# Patient Record
Sex: Female | Born: 1975 | Race: White | Hispanic: No | Marital: Married | Smoking: Former smoker
Health system: Southern US, Community
[De-identification: ages and names within clinical notes are randomized; demographics above are authoritative.]

## PROBLEM LIST (undated history)

## (undated) DIAGNOSIS — G43019 Migraine without aura, intractable, without status migrainosus: Secondary | ICD-10-CM

## (undated) DIAGNOSIS — E7800 Pure hypercholesterolemia, unspecified: Secondary | ICD-10-CM

## (undated) DIAGNOSIS — F909 Attention-deficit hyperactivity disorder, unspecified type: Secondary | ICD-10-CM

## (undated) DIAGNOSIS — N200 Calculus of kidney: Secondary | ICD-10-CM

## (undated) DIAGNOSIS — G43909 Migraine, unspecified, not intractable, without status migrainosus: Secondary | ICD-10-CM

## (undated) HISTORY — DX: Migraine without aura, intractable, without status migrainosus: G43.019

## (undated) HISTORY — DX: Calculus of kidney: N20.0

## (undated) HISTORY — DX: Pure hypercholesterolemia, unspecified: E78.00

## (undated) HISTORY — DX: Attention-deficit hyperactivity disorder, unspecified type: F90.9

## (undated) HISTORY — DX: Migraine, unspecified, not intractable, without status migrainosus: G43.909

---

## 2006-12-22 LAB — CONVERTED CEMR LAB: Pap Smear: NORMAL

## 2007-05-29 ENCOUNTER — Ambulatory Visit: Payer: Self-pay | Admitting: Internal Medicine

## 2007-05-29 DIAGNOSIS — G43829 Menstrual migraine, not intractable, without status migrainosus: Secondary | ICD-10-CM | POA: Insufficient documentation

## 2007-05-29 DIAGNOSIS — R519 Headache, unspecified: Secondary | ICD-10-CM | POA: Insufficient documentation

## 2007-05-29 DIAGNOSIS — R51 Headache: Secondary | ICD-10-CM | POA: Insufficient documentation

## 2007-05-29 DIAGNOSIS — F988 Other specified behavioral and emotional disorders with onset usually occurring in childhood and adolescence: Secondary | ICD-10-CM | POA: Insufficient documentation

## 2007-11-08 ENCOUNTER — Ambulatory Visit: Payer: Self-pay | Admitting: Internal Medicine

## 2007-11-08 DIAGNOSIS — N63 Unspecified lump in unspecified breast: Secondary | ICD-10-CM | POA: Insufficient documentation

## 2007-11-09 ENCOUNTER — Encounter: Payer: Self-pay | Admitting: Internal Medicine

## 2008-03-18 ENCOUNTER — Telehealth: Payer: Self-pay | Admitting: Internal Medicine

## 2008-05-06 ENCOUNTER — Encounter: Payer: Self-pay | Admitting: Internal Medicine

## 2009-02-26 ENCOUNTER — Ambulatory Visit (HOSPITAL_COMMUNITY): Admission: RE | Admit: 2009-02-26 | Discharge: 2009-02-26 | Payer: Self-pay | Admitting: Obstetrics and Gynecology

## 2010-02-06 ENCOUNTER — Inpatient Hospital Stay (HOSPITAL_COMMUNITY): Admission: RE | Admit: 2010-02-06 | Discharge: 2010-02-09 | Payer: Self-pay | Admitting: Obstetrics and Gynecology

## 2010-02-11 ENCOUNTER — Ambulatory Visit: Admission: RE | Admit: 2010-02-11 | Discharge: 2010-02-11 | Payer: Self-pay | Admitting: Obstetrics and Gynecology

## 2010-06-22 NOTE — Assessment & Plan Note (Signed)
Summary: NEW PT OV/OK PER DR JJ/CCM   Vital Signs:  Patient Profile:   35 Years Old Female Height:     69 inches Weight:      155 pounds Temp:     99.2 degrees F oral Pulse rate:   86 / minute Pulse rhythm:   regular Resp:     12 per minute BP sitting:   142 / 80  Vitals Entered By: Lynann Beaver CMA (May 29, 2007 10:49 AM)                 Chief Complaint:  new pt c/o migraines and and URI with chest congestion.  History of Present Illness: New patient with no form   Headache HPI:      The patient comes in for chronic management of headaches which have been unstable.  Since the last visit, the frequency of headaches have not changed, and the intensity of the headaches have not changed.  The headaches will last anywhere from 2 hours to 3 days at a time.  She has approximately 2 headaches per month.  Headaches have been occurring since age 35.  The patient is right handed.  There is a family history of migraine headaches.        The location of the headaches are unilateral-right.  Precipitating factors consist of periovulatory.  The headaches are associated with nausea.        Positive alarm features include change in features from prior H/A's.  The patient denies first or worst H/A of life, change in frequency from prior H/A's, change in severity from prior H/A's, new onset H/A's in middle-age or later, new or progressive H/A lasting days, H/A's with Valsalva (cough/sneeze), mylagia, weight loss, scalp tenderness, and jaw claudication.        Additional history: more focused and intense in right temple.     Current Allergies (reviewed today): No known allergies  Updated/Current Medications (including changes made in today's visit):  ADDERALL 30 MG  TABS (AMPHETAMINE-DEXTROAMPHETAMINE) one by mouth tid LYBREL 90-20 MCG  TABS (LEVONORGESTREL-ETHINYL ESTRAD) one by mouth daily without skipping FROVA 2.5 MG  TABS (FROVATRIPTAN SUCCINATE) one by mouth at first sign of migraine    Past Medical History:    Reviewed history and no changes required:       Headache       ADHD  Past Surgical History:    Reviewed history and no changes required:       Denies surgical history   Family History:    Reviewed history and no changes required:       Family History High cholesterol       Family History Hypertension  Social History:    Reviewed history and no changes required:       Married       Never Smoked       Alcohol use-yes       Drug use-no       Regular exercise-yes   Risk Factors:  Tobacco use:  never Passive smoke exposure:  no Drug use:  no HIV high-risk behavior:  no Alcohol use:  yes Exercise:  yes    Times per week:  3  Family History Risk Factors:    Family History of MI in females < 53 years old:  no    Family History of MI in males < 56 years old:  no  PAP Smear History:     Date of Last PAP Smear:  12/22/2006  Results:  Normal    Review of Systems       The patient complains of hoarseness and prolonged cough.  The patient denies anorexia, fever, weight loss, vision loss, decreased hearing, peripheral edema, abdominal pain, melena, severe indigestion/heartburn, hematuria, and incontinence.     Physical Exam  General:     Well-developed,well-nourished,in no acute distress; alert,appropriate and cooperative throughout examination Head:     Normocephalic and atraumatic without obvious abnormalities. No apparent alopecia or balding. Eyes:     No corneal or conjunctival inflammation noted. EOMI. Perrla. Funduscopic exam benign, without hemorrhages, exudates or papilledema. Vision grossly normal. Nose:     External nasal examination shows no deformity or inflammation. Nasal mucosa are pink and moist without lesions or exudates. Mouth:     Oral mucosa and oropharynx without lesions or exudates.  Teeth in good repair. Neck:     No deformities, masses, or tenderness noted. Lungs:     Normal respiratory effort, chest expands  symmetrically. Lungs are clear to auscultation, no crackles or wheezes. Heart:     Normal rate and regular rhythm. S1 and S2 normal without gallop, murmur, click, rub or other extra sounds. Abdomen:     Bowel sounds positive,abdomen soft and non-tender without masses, organomegaly or hernias noted. Msk:     No deformity or scoliosis noted of thoracic or lumbar spine.   Pulses:     R and L carotid,radial,femoral,dorsalis pedis and posterior tibial pulses are full and equal bilaterally Extremities:     No clubbing, cyanosis, edema, or deformity noted with normal full range of motion of all joints.   Neurologic:     No cranial nerve deficits noted. Station and gait are normal. Plantar reflexes are down-going bilaterally. DTRs are symmetrical throughout. Sensory, motor and coordinative functions appear intact. Skin:     Intact without suspicious lesions or rashes Cervical Nodes:     No lymphadenopathy noted Axillary Nodes:     No palpable lymphadenopathy Psych:     Cognition and judgment appear intact. Alert and cooperative with normal attention span and concentration. No apparent delusions, illusions, hallucinations    Impression & Recommendations:  Problem # 1:  MENST MIGRAINE W/O INTRACT W/O STATUS MIGRNOSUS (ICD-346.40) samples of  frova    Headache diary reviewed.  Her updated medication list for this problem includes:    Frova 2.5 Mg Tabs (Frovatriptan succinate) ..... 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 One by mouth tid 2)  Lybrel 90-20 Mcg Tabs (Levonorgestrel-ethinyl estrad) .... One by mouth daily without skipping 3)  Frova 2.5 Mg Tabs (Frovatriptan succinate) .... One by mouth at first sign of migraine   Patient Instructions: 1)  Please schedule a follow-up appointment in 2 months.     Prescriptions: LYBREL 90-20 MCG  TABS (LEVONORGESTREL-ETHINYL ESTRAD) one by mouth daily without skipping  #28 x 11   Entered and Authorized by:   Stacie Glaze MD   Signed by:   Stacie Glaze MD on 05/29/2007   Method used:   Print then Give to Patient   RxID:   [id number]  ]

## 2010-06-22 NOTE — Progress Notes (Signed)
Summary: dog bite  Phone Note Call from Patient   Caller: Dad Summary of Call: [phone] Dog bite (pt's dog) last night.  3 puncture wounds that seem to be getting infected.  Dad wants Dr. Lovell Sheehan to see her today. Lesions are red and swollen this am.  Initial call taken by: Lynann Beaver CMA,  March 18, 2008 10:57 AM  Follow-up for Phone Call        Father called again, daughters left thumb is more reddened and swollen, anxious to get some directives from Dr Lovell Sheehan, OV or RX?  Daughter bitten by family personal dogs,all shots up to date. Daughter just graduated from [employer], he is quite sure she has had Td booster, we do not have any record in EMR of her Td status.  Left message for daughter to call back with Td status Follow-up by: Sid Falcon LPN,  March 18, 2008 12:50 PM  Additional Follow-up for Phone Call Additional follow up Details #1::        per d rjenkins- augmentin 875 two times a day for 7 days Additional Follow-up by: Willy Eddy, LPN,  March 18, 2008 1:30 PM    New/Updated Medications: AUGMENTIN 875-125 MG TABS (AMOXICILLIN-POT CLAVULANATE) one by mouth two times a day x 7 days.   Prescriptions: AUGMENTIN 875-125 MG TABS (AMOXICILLIN-POT CLAVULANATE) one by mouth two times a day x 7 days.  #14 x 0   Entered by:   Lynann Beaver CMA   Authorized by:   Stacie Glaze MD   Signed by:   Lynann Beaver CMA on 03/18/2008   Method used:   Electronically to        [facility] [address]  [id number] (ret[facility])       [address]       [place], [place]  [address]       Ph: [phone] or [phone]       Fax: [phone]   RxID:   [id number]  Pt's father notified.

## 2010-06-22 NOTE — Assessment & Plan Note (Signed)
Summary: bump on chest/per bonnye/jls   Vital Signs:  Patient Profile:   35 Years Old Female Height:     69 inches Temp:     98.7 degrees F oral Pulse rate:   76 / minute Resp:     14 per minute BP sitting:   130 / 80  (left arm)  Vitals Entered By: Willy Eddy, LPN (November 08, 2007 3:59 PM)                 Visit Type:  acuteBreast mass in the left  PCP:  Stacie Glaze MD  Chief Complaint:  c/o lump on left breast.  History of Present Illness: breast mas in the left breast about 3  on the clock with fibercystic changes and some slight tenderness at the site noted anbout a week ago    Current Allergies: No known allergies       Physical Exam  General:     Well-developed,well-nourished,in no acute distress; alert,appropriate and cooperative throughout examination Eyes:     No corneal or conjunctival inflammation noted. EOMI. Perrla. Funduscopic exam benign, without hemorrhages, exudates or papilledema. Vision grossly normal. Nose:     External nasal examination shows no deformity or inflammation. Nasal mucosa are pink and moist without lesions or exudates. Mouth:     Oral mucosa and oropharynx without lesions or exudates.  Teeth in good repair. Breasts:     L breast thickening.  , cyst Lungs:     Normal respiratory effort, chest expands symmetrically. Lungs are clear to auscultation, no crackles or wheezes.    Impression & Recommendations:  Problem # 1:  BREAST MASS, LEFT (ICD-611.72) Mammogram was ordered today. The patient will be sent to a breast surgeon following mammogram for evaluation.  Orders: Radiology Referral (Radiology)   Problem # 2:  HEADACHE (ICD-784.0) stable patern Her updated medication list for this problem includes:    Frova 2.5 Mg Tabs (Frovatriptan succinate) ..... One by mouth at first sign of migraine Headache diary reviewed.   Complete Medication List: 1)  Adderall 30 Mg Tabs (Amphetamine-dextroamphetamine) .... One by  mouth tid 2)  Lybrel 90-20 Mcg Tabs (Levonorgestrel-ethinyl estrad) .... One by mouth daily without skipping 3)  Frova 2.5 Mg Tabs (Frovatriptan succinate) .... One by mouth at first sign of migraine    ]

## 2010-08-05 LAB — CBC
HCT: 28.4 % — ABNORMAL LOW (ref 36.0–46.0)
HCT: 28.9 % — ABNORMAL LOW (ref 36.0–46.0)
HCT: 34.1 % — ABNORMAL LOW (ref 36.0–46.0)
Hemoglobin: 10.1 g/dL — ABNORMAL LOW (ref 12.0–15.0)
Hemoglobin: 11.2 g/dL — ABNORMAL LOW (ref 12.0–15.0)
Hemoglobin: 9.7 g/dL — ABNORMAL LOW (ref 12.0–15.0)
MCH: 31.2 pg (ref 26.0–34.0)
MCH: 32.2 pg (ref 26.0–34.0)
MCH: 33 pg (ref 26.0–34.0)
MCHC: 32.9 g/dL (ref 30.0–36.0)
MCHC: 34.2 g/dL (ref 30.0–36.0)
MCHC: 35 g/dL (ref 30.0–36.0)
MCV: 94.2 fL (ref 78.0–100.0)
MCV: 94.3 fL (ref 78.0–100.0)
MCV: 95 fL (ref 78.0–100.0)
Platelets: 179 10*3/uL (ref 150–400)
Platelets: 179 10*3/uL (ref 150–400)
Platelets: 218 10*3/uL (ref 150–400)
RBC: 3.02 MIL/uL — ABNORMAL LOW (ref 3.87–5.11)
RBC: 3.07 MIL/uL — ABNORMAL LOW (ref 3.87–5.11)
RBC: 3.59 MIL/uL — ABNORMAL LOW (ref 3.87–5.11)
RDW: 14.1 % (ref 11.5–15.5)
RDW: 15.1 % (ref 11.5–15.5)
RDW: 15.5 % (ref 11.5–15.5)
WBC: 10.6 10*3/uL — ABNORMAL HIGH (ref 4.0–10.5)
WBC: 8.3 10*3/uL (ref 4.0–10.5)
WBC: 8.7 10*3/uL (ref 4.0–10.5)

## 2010-08-05 LAB — RPR: RPR Ser Ql: NONREACTIVE

## 2013-05-23 HISTORY — PX: BREAST ENHANCEMENT SURGERY: SHX7

## 2016-03-16 ENCOUNTER — Encounter: Payer: Self-pay | Admitting: Neurology

## 2016-03-16 ENCOUNTER — Ambulatory Visit (INDEPENDENT_AMBULATORY_CARE_PROVIDER_SITE_OTHER): Payer: 59 | Admitting: Neurology

## 2016-03-16 DIAGNOSIS — G43019 Migraine without aura, intractable, without status migrainosus: Secondary | ICD-10-CM

## 2016-03-16 HISTORY — DX: Migraine without aura, intractable, without status migrainosus: G43.019

## 2016-03-16 MED ORDER — DEXAMETHASONE 2 MG PO TABS: ORAL_TABLET | ORAL | 0 refills | Status: DC

## 2016-03-16 MED ORDER — ZONISAMIDE 25 MG PO CAPS: ORAL_CAPSULE | ORAL | 2 refills | Status: DC

## 2016-03-16 NOTE — Progress Notes (Signed)
Reason for visit: Migraine   Referring physician: Dr. Rudi Coco is a 40 y.o. female  History of present illness:  Sharon Osborn is a 40 year old right-handed white female with a history of intractable migraine. The patient has had migraine headaches off and on since age 60. The patient had some increase in frequency when she was in college, but during her childbearing years, she really had no headaches. The patient over the last year has begun having increased frequency of headache, but over the last 6 months the headaches have become very frequent with on average 16 headache days a month. The patient may have 7 days of headache that begin just prior to her menstrual cycle, and continue on. She may go 3 days without headache, and then get another headache lasting about a week. The patient has multiple activators for her headache that include stress, alcohol intake, chocolate, certain odors such as gasoline or perfumes, bright light, salty foods may also bring on headache. The patient has been seen through [facility], she was placed on Inderal in low dose taking 10 mg daily. The patient could not tolerate even this low dose, and she had to stop the medication. The patient has been taking Maxalt and diclofenac for the headache with some benefit, but she is requiring large amounts of these medications. The patient may have scintillating scotoma prior to the onset of the headache, she may also have some cognitive slowing and slurred speech before the headache begins. She denies any numbness or weakness with the headache, she does have nausea and vomiting and photophobia and phonophobia. The headache may last several hours, sleep does not help the headache. The patient is having difficulty keeping up with her work because of the headache. She comes to this office for an evaluation. The headache is usually in the right greater than left temporal regions.  Past Medical History:  Diagnosis  Date  . ADHD   . High cholesterol   . Migraine     Past Surgical History:  Procedure Laterality Date  . BREAST ENHANCEMENT SURGERY  2015    Family History  Problem Relation Age of Onset  . Migraines Mother   . Heart disease Mother   . High Cholesterol Mother   . Breast cancer Mother   . Heart disease Father   . High Cholesterol Father   . Migraines Sister   . Seizures Sister   . Parkinson's disease Maternal Grandmother   . Heart disease Maternal Grandfather   . Heart disease Paternal Grandfather   . High Cholesterol Paternal Grandfather   . Migraines Sister     Social history:  reports that she quit smoking about 9 years ago. She has never used smokeless tobacco. She reports that she does not drink alcohol or use drugs.  Medications:  Prior to Admission medications   Medication Sig Start Date End Date Taking? Authorizing Provider  amphetamine-dextroamphetamine (ADDERALL) 20 MG tablet  02/04/16  Yes Historical Provider, MD  buPROPion [facility] SR) 150 MG 12 hr tablet  01/02/16  Yes Historical Provider, MD  diclofenac (VOLTAREN) 75 MG EC tablet  02/19/16  Yes Historical Provider, MD  rizatriptan (MAXALT-MLT) 10 MG disintegrating tablet TAKE ONE (1) TABLET BY MOUTH AT ONSET OF HEADACHE MAY REPEAT ONCE IN 2 HOURS. MAX OF 2/24 HOURS or 6 PER WEEK 02/29/16  Yes Historical Provider, MD      Allergies  Allergen Reactions  . Amoxicillin Hives  . Inderal [Propranolol] Other (See  Comments)    Fainting spells    ROS:  Out of a complete 14 system review of symptoms, the patient complains only of the following symptoms, and all other reviewed systems are negative.  Headache  Height 5\' 10"  (1.778 m), weight 134 lb (60.8 kg).  Physical Exam  General: The patient is alert and cooperative at the time of the examination.  Eyes: Pupils are equal, round, and reactive to light. Discs are flat bilaterally.  Neck: The neck is supple, no carotid bruits are noted.  Respiratory: The  respiratory examination is clear.  Cardiovascular: The cardiovascular examination reveals a regular rate and rhythm, no obvious murmurs or rubs are noted.  Skin: Extremities are without significant edema.  Neurologic Exam  Mental status: The patient is alert and oriented x 3 at the time of the examination. The patient has apparent normal recent and remote memory, with an apparently normal attention span and concentration ability.  Cranial nerves: Facial symmetry is present. There is good sensation of the face to pinprick and soft touch bilaterally. The strength of the facial muscles and the muscles to head turning and shoulder shrug are normal bilaterally. Speech is well enunciated, no aphasia or dysarthria is noted. Extraocular movements are full. Visual fields are full. The tongue is midline, and the patient has symmetric elevation of the soft palate. No obvious hearing deficits are noted.  Motor: The motor testing reveals 5 over 5 strength of all 4 extremities. Good symmetric motor tone is noted throughout.  Sensory: Sensory testing is intact to pinprick, soft touch, vibration sensation, and position sense on all 4 extremities. No evidence of extinction is noted.  Coordination: Cerebellar testing reveals good finger-nose-finger and heel-to-shin bilaterally.  Gait and station: Gait is normal. Tandem gait is normal. Romberg is negative. No drift is seen.  Reflexes: Deep tendon reflexes are symmetric and normal bilaterally. Toes are downgoing bilaterally.   Assessment/Plan:  1. Migraine headache  The patient has intractable headaches at this time, averaging 16 headache days a month. The patient has not been on any effective prophylactic medication course. She has a history of renal calculi, she has required hospitalizations on 2 occasions because of this. The patient will be placed on Zonegran, working up on the dose. The patient will continue her Maxalt and diclofenac. A prescription was  given for a Decadron taper for the next prolonged headache. She will follow-up in 2 months. The patient will call our office for dose adjustments. In the future, she may be a candidate for Botox.  Sharon Osborn. Sharon Jorge Retz MD 03/16/2016 11:40 AM  [facility] [address] [place], [place] [id number]  Phone [phone] Fax [phone]

## 2016-03-16 NOTE — Patient Instructions (Addendum)

## 2016-03-17 ENCOUNTER — Ambulatory Visit: Payer: 59 | Admitting: Neurology

## 2016-05-23 DIAGNOSIS — N200 Calculus of kidney: Secondary | ICD-10-CM

## 2016-05-23 HISTORY — DX: Calculus of kidney: N20.0

## 2016-05-25 ENCOUNTER — Telehealth: Payer: Self-pay | Admitting: Neurology

## 2016-05-25 MED ORDER — DICLOFENAC SODIUM 75 MG PO TBEC: 75.0000 mg | DELAYED_RELEASE_TABLET | Freq: Two times a day (BID) | ORAL | 2 refills | Status: DC | PRN

## 2016-05-25 NOTE — Addendum Note (Signed)
Addended by: Stephanie AcreWILLIS, Kathya Wilz on: 05/25/2016 10:49 AM   Modules accepted: Orders

## 2016-05-25 NOTE — Telephone Encounter (Signed)
Pt requesting refill for diclofenac (VOLTAREN) 75 MG EC tablet.

## 2016-05-25 NOTE — Telephone Encounter (Signed)
I will prescribe the diclofenac.

## 2016-05-25 NOTE — Telephone Encounter (Signed)
New pt w/ migraines seen in Oct. Has follow-up scheduled later this month. Voltaren reported by pt, prescribed by previous provider.

## 2016-05-26 ENCOUNTER — Ambulatory Visit: Payer: 59 | Admitting: Adult Health

## 2016-05-31 ENCOUNTER — Telehealth: Payer: Self-pay | Admitting: Neurology

## 2016-05-31 NOTE — Telephone Encounter (Signed)
Patient says her hands get really cold and go completely white intermittently especially when temperature is really cold. Could this be a side effect of rizatriptan (MAXALT-MLT) 10 MG disintegrating tablet.

## 2016-05-31 NOTE — Telephone Encounter (Signed)
I called patient. 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

## 2016-06-16 ENCOUNTER — Ambulatory Visit: Payer: 59 | Admitting: Adult Health

## 2016-06-20 ENCOUNTER — Telehealth: Payer: Self-pay | Admitting: Neurology

## 2016-06-20 MED ORDER — RIZATRIPTAN BENZOATE 10 MG PO TBDP: ORAL_TABLET | ORAL | 5 refills | Status: DC

## 2016-06-20 NOTE — Addendum Note (Signed)
Addended by: Donnelly AngelicaHOGAN, Amylia Collazos L on: 06/20/2016 05:15 PM   Modules accepted: Orders

## 2016-06-20 NOTE — Telephone Encounter (Signed)
Pt called requesting refill forrizatriptan (MAXALT-MLT) 10 MG disintegrating tablet

## 2016-06-20 NOTE — Telephone Encounter (Signed)
Refills e-scribed as requested. 

## 2016-06-27 ENCOUNTER — Other Ambulatory Visit: Payer: Self-pay

## 2016-06-27 ENCOUNTER — Other Ambulatory Visit: Payer: Self-pay | Admitting: Neurology

## 2016-06-27 MED ORDER — RIZATRIPTAN BENZOATE 10 MG PO TABS: 10.0000 mg | ORAL_TABLET | Freq: Three times a day (TID) | ORAL | 5 refills | Status: DC | PRN

## 2016-07-07 ENCOUNTER — Encounter: Payer: Self-pay | Admitting: Adult Health

## 2016-07-07 ENCOUNTER — Ambulatory Visit (INDEPENDENT_AMBULATORY_CARE_PROVIDER_SITE_OTHER): Payer: 59 | Admitting: Adult Health

## 2016-07-07 VITALS — BP 142/80 | HR 96 | Resp 20 | Ht 70.0 in | Wt 139.0 lb

## 2016-07-07 DIAGNOSIS — G43119 Migraine with aura, intractable, without status migrainosus: Secondary | ICD-10-CM

## 2016-07-07 NOTE — Progress Notes (Signed)
PATIENT: Sharon Osborn DOB: [birth year]  REASON FOR VISIT: follow up- intractable migraines HISTORY FROM: patient  HISTORY OF PRESENT ILLNESS: Sharon Osborn is a 41 year old female with a history of intractable migraines. She returns today for follow-up. She reports that she was on Zonegran was stopped medication in January. She states it made her feel "spaced out." She also that the flu and kidney stones in January and never restarted the medication. She states that she has approximately 17 headache days a month. Her headaches always occur in the right temporal region. She does have photophobia, phonophobia, nausea and vomiting. She states that she will usually see stars in her periphery before the headache starts. She reports that she does get some benefit from Maxalt but is not working as well as it was. She has tried diclofenac, Zonegran, Maxalt, Imitrex and Inderal with limited or no benefit. She returns today for an evaluation.  HISTORY 03/16/16 (WILLIS): Sharon Osborn is a 41 year old right-handed white female with a history of intractable migraine. The patient has had migraine headaches off and on since age 46. The patient had some increase in frequency when she was in college, but during her childbearing years, she really had no headaches. The patient over the last year has begun having increased frequency of headache, but over the last 6 months the headaches have become very frequent with on average 16 headache days a month. The patient may have 7 days of headache that begin just prior to her menstrual cycle, and continue on. She may go 3 days without headache, and then get another headache lasting about a week. The patient has multiple activators for her headache that include stress, alcohol intake, chocolate, certain odors such as gasoline or perfumes, bright light, salty foods may also bring on headache. The patient has been seen through [facility], she was placed on Inderal in low dose  taking 10 mg daily. The patient could not tolerate even this low dose, and she had to stop the medication. The patient has been taking Maxalt and diclofenac for the headache with some benefit, but she is requiring large amounts of these medications. The patient may have scintillating scotoma prior to the onset of the headache, she may also have some cognitive slowing and slurred speech before the headache begins. She denies any numbness or weakness with the headache, she does have nausea and vomiting and photophobia and phonophobia. The headache may last several hours, sleep does not help the headache. The patient is having difficulty keeping up with her work because of the headache. She comes to this office for an evaluation. The headache is usually in the right greater than left temporal regions.  REVIEW OF SYSTEMS: Out of a complete 14 system review of symptoms, the patient complains only of the following symptoms, and all other reviewed systems are negative.  Daytime sleepiness  ALLERGIES: Allergies  Allergen Reactions  . Amoxicillin Hives  . Inderal [Propranolol] Other (See Comments)    Fainting spells    HOME MEDICATIONS: Outpatient Medications Prior to Visit  Medication Sig Dispense Refill  . amphetamine-dextroamphetamine (ADDERALL) 20 MG tablet     . buPROPion (WELLBUTRIN SR) 150 MG 12 hr tablet     . dexamethasone (DECADRON) 2 MG tablet Take 3 tablets first day, 2 the next and 1 the next day 6 tablet 0  . diclofenac (VOLTAREN) 75 MG EC tablet Take 1 tablet (75 mg total) by mouth 2 (two) times daily as needed. 60 tablet 2  .  rizatriptan (MAXALT) 10 MG tablet Take 1 tablet (10 mg total) by mouth 3 (three) times daily as needed for migraine. 10 tablet 5  . zonisamide (ZONEGRAN) 25 MG capsule One capsule twice a day for one week, then take 2 capsules twice a day 120 capsule 2   No facility-administered medications prior to visit.     PAST MEDICAL HISTORY: Past Medical History:    Diagnosis Date  . ADHD   . Common migraine with intractable migraine 03/16/2016  . High cholesterol   . Migraine     PAST SURGICAL HISTORY: Past Surgical History:  Procedure Laterality Date  . BREAST ENHANCEMENT SURGERY  2015    FAMILY HISTORY: Family History  Problem Relation Age of Onset  . Migraines Mother   . Heart disease Mother   . High Cholesterol Mother   . Breast cancer Mother   . Heart disease Father   . High Cholesterol Father   . Migraines Sister   . Seizures Sister   . Parkinson's disease Maternal Grandmother   . Heart disease Maternal Grandfather   . Heart disease Paternal Grandfather   . High Cholesterol Paternal Grandfather   . Migraines Sister     SOCIAL HISTORY: Social History   Social History  . Marital status: Married    Spouse name: N/A  . Number of children: 2  . Years of education: Grad school   Occupational History  . [employer]    Social History Main Topics  . Smoking status: Former Smoker    Quit date: 03/17/2007  . Smokeless tobacco: Never Used  . Alcohol use No     Comment: 2010  . Drug use: No  . Sexual activity: Not on file     Comment: Married   Other Topics Concern  . Not on file   Social History Narrative   Lives at home w/ her husband and 2 sons   Right-handed   Caffeine: 4-6 cups of coffee per day      PHYSICAL EXAM  Vitals:   07/07/16 0739  BP: (!) 142/80  Pulse: 96  Resp: 20  Weight: 139 lb (63 kg)  Height: 5\' 10"  (1.778 m)   Body mass index is 19.94 kg/m.  Generalized: Well developed, in no acute distress   Neurological examination  Mentation: Alert oriented to time, place, history taking. Follows all commands speech and language fluent Cranial nerve II-XII: Pupils were equal round reactive to light. Extraocular movements were full, visual field were full on confrontational test. Facial sensation and strength were normal. Uvula tongue midline. Head turning and shoulder shrug  were normal  and symmetric. Motor: The motor testing reveals 5 over 5 strength of all 4 extremities. Good symmetric motor tone is noted throughout.  Sensory: Sensory testing is intact to soft touch on all 4 extremities. No evidence of extinction is noted.  Coordination: Cerebellar testing reveals good finger-nose-finger and heel-to-shin bilaterally.  Gait and station: Gait is normal. Tandem gait is normal. Romberg is negative. No drift is seen.  Reflexes: Deep tendon reflexes are symmetric and normal bilaterally.   DIAGNOSTIC DATA (LABS, IMAGING, TESTING) - I reviewed patient records, labs, notes, testing and imaging myself where available.  Lab Results  Component Value Date   WBC 8.3 02/09/2010   HGB 9.7 (L) 02/09/2010   HCT 28.4 (L) 02/09/2010   MCV 94.3 02/09/2010   PLT 179 02/09/2010      ASSESSMENT AND PLAN 41 y.o. year old female  has a past  medical history of ADHD; Common migraine with intractable migraine (03/16/2016); High cholesterol; and Migraine. here with:  1. Migraine headaches  The patient continues to have migraine headaches. She has not responded to previous medication. She has tried and failed diclofenac, Zonegran, Maxalt, Imitrex and Inderal. She would like to try Botox. I will get this set up for the patient. An appointment will be set up as soon as her insurance approves Botox. Otherwise she will plan to follow-up in 6 months or sooner if needed.  I spent 15 minutes with the patient 50% of this time was spent discussing Botox treatment.    Butch Penny, MSN, NP-C 07/07/2016, 7:36 AM [facility] [address] [place], [place] [address] [phone]

## 2016-07-07 NOTE — Progress Notes (Signed)
I have read the note, and I agree with the clinical assessment and plan.  Sharon Osborn   

## 2016-07-07 NOTE — Patient Instructions (Signed)
Botox therapy will be initiated  If your symptoms worsen or you develop new symptoms please let us know.

## 2016-07-15 ENCOUNTER — Telehealth: Payer: Self-pay | Admitting: Neurology

## 2016-07-15 MED ORDER — SUMATRIPTAN SUCCINATE 100 MG PO TABS: 100.0000 mg | ORAL_TABLET | Freq: Two times a day (BID) | ORAL | 2 refills | Status: DC | PRN

## 2016-07-15 NOTE — Telephone Encounter (Signed)
Called pt pharmacy. Spoke with [place]. He [facility] only [facility] 4 tablets at a time. This is for 12 day supply. She just got rx on 07/08/16. On 07/20/16 , she can receive 4 more tablets.

## 2016-07-15 NOTE — Telephone Encounter (Signed)
Dr Anne HahnWillis- what would you like to do? See message below

## 2016-07-15 NOTE — Telephone Encounter (Signed)
Rx for botox has been sent over to [facility].

## 2016-07-15 NOTE — Telephone Encounter (Signed)
Patient has requested a refill on rizatriptan.

## 2016-07-15 NOTE — Telephone Encounter (Signed)
I called patient. The insurance company will only allow 4 tablets of Maxalt to be given at one time, I will try Imitrex to see if this will work out better for her.

## 2016-07-15 NOTE — Telephone Encounter (Signed)
Called and spoke to pt. Advised rx sent 06/27/16 qty 10, 5 refills. She was only given 4 tablets.   Advised I will call pharmacy to see what happened and call back to advise. She verbalized understanding.

## 2016-07-21 ENCOUNTER — Encounter: Payer: Self-pay | Admitting: Neurology

## 2016-07-21 ENCOUNTER — Ambulatory Visit (INDEPENDENT_AMBULATORY_CARE_PROVIDER_SITE_OTHER): Payer: 59 | Admitting: Neurology

## 2016-07-21 VITALS — BP 120/77 | HR 64 | Ht 70.0 in | Wt 139.5 lb

## 2016-07-21 DIAGNOSIS — G43019 Migraine without aura, intractable, without status migrainosus: Secondary | ICD-10-CM | POA: Diagnosis not present

## 2016-07-21 MED ORDER — ELETRIPTAN HYDROBROMIDE 40 MG PO TABS: 40.0000 mg | ORAL_TABLET | Freq: Two times a day (BID) | ORAL | 3 refills | Status: DC | PRN

## 2016-07-21 NOTE — Procedures (Signed)
     BOTOX PROCEDURE NOTE FOR MIGRAINE HEADACHE   HISTORY: Sharon Osborn is a 41 year old patient with a history of intractable migraine. She has had very frequent migraine, medication so far have not been beneficial. She has just come off of a 7 day daily headache, she has missed one day of work. She comes in for her first Botox treatment session.   Description of procedure:  The patient was placed in a sitting position. The standard protocol was used for Botox as follows, with 5 units of Botox injected at each site:   -Procerus muscle, midline injection  -Corrugator muscle, bilateral injection  -Frontalis muscle, bilateral injection, with 2 sites each side, medial injection was performed in the upper one third of the frontalis muscle, in the region vertical from the medial inferior edge of the superior orbital rim. The lateral injection was again in the upper one third of the forehead vertically above the lateral limbus of the cornea, 1.5 cm lateral to the medial injection site.  -Temporalis muscle injection, 4 sites, bilaterally. The first injection was 3 cm above the tragus of the ear, second injection site was 1.5 cm to 3 cm up from the first injection site in line with the tragus of the ear. The third injection site was 1.5-3 cm forward between the first 2 injection sites. The fourth injection site was 1.5 cm posterior to the second injection site.  -Occipitalis muscle injection, 3 sites, bilaterally. The first injection was done one half way between the occipital protuberance and the tip of the mastoid process behind the ear. The second injection site was done lateral and superior to the first, 1 fingerbreadth from the first injection. The third injection site was 1 fingerbreadth superiorly and medially from the first injection site.  -Cervical paraspinal muscle injection, 2 sites, bilateral, the first injection site was 1 cm from the midline of the cervical spine, 3 cm inferior to the  lower border of the occipital protuberance. The second injection site was 1.5 cm superiorly and laterally to the first injection site.  -Trapezius muscle injection was performed at 3 sites, bilaterally. The first injection site was in the upper trapezius muscle halfway between the inflection point of the neck, and the acromion. The second injection site was one half way between the acromion and the first injection site. The third injection was done between the first injection site and the inflection point of the neck.   A 200 unit bottle of Botox was used, 155 units were injected, the rest of the Botox was wasted. The patient tolerated the procedure well, there were no complications of the above procedure.  Botox NDC [id number] Lot number [id number] Expiration date August 2020

## 2016-07-21 NOTE — Progress Notes (Signed)
Please refer to Botox procedure note.  The patient was taken off of Imitrex, this does not seem to help the headache, she was placed on Relpax. The patient has just had a 7 day daily headache. She has missed one day of work.

## 2016-08-09 ENCOUNTER — Ambulatory Visit: Payer: 59 | Admitting: Adult Health

## 2016-10-11 ENCOUNTER — Telehealth: Payer: Self-pay | Admitting: Neurology

## 2016-10-11 NOTE — Telephone Encounter (Signed)
[facility] [phone] confirmed delivery for 10/25/16.

## 2016-10-18 NOTE — Telephone Encounter (Signed)
Noted, thank you

## 2016-11-04 ENCOUNTER — Ambulatory Visit (INDEPENDENT_AMBULATORY_CARE_PROVIDER_SITE_OTHER): Payer: 59 | Admitting: Neurology

## 2016-11-04 ENCOUNTER — Encounter: Payer: Self-pay | Admitting: Neurology

## 2016-11-04 VITALS — BP 110/74 | HR 79

## 2016-11-04 DIAGNOSIS — G43019 Migraine without aura, intractable, without status migrainosus: Secondary | ICD-10-CM

## 2016-11-04 NOTE — Progress Notes (Signed)
Please refer to Botox procedure note. 

## 2016-11-04 NOTE — Procedures (Signed)
     BOTOX PROCEDURE NOTE FOR MIGRAINE HEADACHE   HISTORY: Sharon Osborn is a 41 year old patient with a history of intractable migraine headache. The patient has come in for her second Botox injection. The first injection resulted in a significant reduction in headache severity, she believes that the frequency of the headache was about the same but she was able to easily control the headaches with rescue medications. She has not missed work because of the headache. Within the last 3 weeks prior to this injection, the headaches have returned to their usual frequency and severity. The patient is pleased with the response from the Botox.   Description of procedure:  The patient was placed in a sitting position. The standard protocol was used for Botox as follows, with 5 units of Botox injected at each site:   -Procerus muscle, midline injection  -Corrugator muscle, bilateral injection  -Frontalis muscle, bilateral injection, with 2 sites each side, medial injection was performed in the upper one third of the frontalis muscle, in the region vertical from the medial inferior edge of the superior orbital rim. The lateral injection was again in the upper one third of the forehead vertically above the lateral limbus of the cornea, 1.5 cm lateral to the medial injection site.  -Temporalis muscle injection, 4 sites, bilaterally. The first injection was 3 cm above the tragus of the ear, second injection site was 1.5 cm to 3 cm up from the first injection site in line with the tragus of the ear. The third injection site was 1.5-3 cm forward between the first 2 injection sites. The fourth injection site was 1.5 cm posterior to the second injection site.  -Occipitalis muscle injection, 3 sites, bilaterally. The first injection was done one half way between the occipital protuberance and the tip of the mastoid process behind the ear. The second injection site was done lateral and superior to the first, 1  fingerbreadth from the first injection. The third injection site was 1 fingerbreadth superiorly and medially from the first injection site.  -Cervical paraspinal muscle injection, 2 sites, bilateral, the first injection site was 1 cm from the midline of the cervical spine, 3 cm inferior to the lower border of the occipital protuberance. The second injection site was 1.5 cm superiorly and laterally to the first injection site.  -Trapezius muscle injection was performed at 3 sites, bilaterally. The first injection site was in the upper trapezius muscle halfway between the inflection point of the neck, and the acromion. The second injection site was one half way between the acromion and the first injection site. The third injection was done between the first injection site and the inflection point of the neck.   A 200 unit bottle of Botox was used, 155 units were injected, the rest of the Botox was wasted. The patient tolerated the procedure well, there were no complications of the above procedure.  Botox NDC [id number] Lot number [id number] Expiration date 04/2019

## 2016-11-14 ENCOUNTER — Other Ambulatory Visit: Payer: Self-pay | Admitting: Neurology

## 2016-11-15 ENCOUNTER — Telehealth: Payer: Self-pay | Admitting: Neurology

## 2016-11-15 MED ORDER — PREDNISONE 5 MG PO TABS: ORAL_TABLET | ORAL | 0 refills | Status: DC

## 2016-11-15 NOTE — Telephone Encounter (Signed)
I called patient. The patient's had daily headaches since the Botox injection. I will send in a prednisone Dosepak, if this is not effective, we will bring the patient in for a Depacon injection.

## 2016-11-15 NOTE — Addendum Note (Signed)
Addended by: York SpanielWILLIS, CHARLES K on: 11/15/2016 12:56 PM   Modules accepted: Orders

## 2016-11-15 NOTE — Telephone Encounter (Signed)
Pt calling because since Botox approx 10 days ago pt said she has had a migraine everyday either all day or for majority of the day.  She is wanting a call back advising what she can do

## 2016-11-24 ENCOUNTER — Telehealth: Payer: Self-pay

## 2016-11-24 NOTE — Telephone Encounter (Signed)
Patient called office in reference to completing prednisone Dosepak and continuing to have migraines.  Please call

## 2016-11-24 NOTE — Telephone Encounter (Signed)
RN call patient back about her increase headaches. Pt stated she finish the prednisone steroids yesterday. She was on the steroids for 6 days. Pt has been taking the voltaren pills daily. She also takes the eletriptan daily for onset of headaches. Pt is not taking the zonegran.it cause side effects. She had her last botox in June 2018. Pt stated when she first got botox in March she had relief, but the last injection did not give much relief. Pt stated her pain level is a 9 today. When she takes the voltaren, and eletriptan it gives relief but in about 2 to hrs the headache comes back. For the past two weeks the headaches are daily. Rn stated a message will be sent to Dr. Pearlean BrownieSethi the work in am. Pt verbalized understanding.

## 2016-11-24 NOTE — Telephone Encounter (Signed)
Left vm for patient to call back about still having a headache. PT just completed a prednisone dosepak, and is still having headache. PT is currently on botox, and receive that injection on 11/04/2016.

## 2016-11-24 NOTE — Telephone Encounter (Signed)
If the patient is willing to come in and she can have Depacon infusion in the office and the nurse is available to do so

## 2016-11-24 NOTE — Telephone Encounter (Signed)
Rn call patient that Dr. Pearlean BrownieSethi stated she can come in for infusion for her headaches. Rn stated infusion nurse was notified of her having a headache. RN stated the infusion nurse stated she needs to come in before 0200. Rn stated the infusion nurse can see her now. Rn remind pt to come to [facility] now. PT stated she was on the way.

## 2016-11-24 NOTE — Telephone Encounter (Signed)
Per infusion nurse pt had steroid infusion. Pt drove herself to the office with a headache of 9. Pt stated to infusion nurse that it help the headache and she feels better.Mindy explain to patient about the infusion is not a monthly or daily regimen. [facility] and will take her prescribed medications.

## 2016-11-27 MED ORDER — PREDNISONE 10 MG PO TABS: ORAL_TABLET | ORAL | 0 refills | Status: DC

## 2016-11-27 NOTE — Addendum Note (Signed)
Addended by: York SpanielWILLIS, Tovia Kisner K on: 11/27/2016 03:31 PM   Modules accepted: Orders

## 2016-11-27 NOTE — Telephone Encounter (Signed)
I called patient. The patient has had frequent almost daily headaches. The epicondyle injection did help, but the headache came back after a day and a half. I will place her on a 12 day 10 mg prednisone Dosepak.  The patient should be a good candidate for [facility]. I will get a form filled out for her.  The patient claims that Zonegran made her too spacey.

## 2016-11-28 NOTE — Telephone Encounter (Signed)
Spoke to [place] - [place] private, personal fax at home.  [employer] start form faxed to her so she could sign and she faxed it back.  Completed form faxed and confirmed to [employer].

## 2016-12-12 ENCOUNTER — Telehealth: Payer: Self-pay | Admitting: *Deleted

## 2016-12-12 MED ORDER — DICLOFENAC SODIUM 75 MG PO TBEC: 75.0000 mg | DELAYED_RELEASE_TABLET | Freq: Two times a day (BID) | ORAL | 1 refills | Status: DC | PRN

## 2016-12-12 NOTE — Telephone Encounter (Signed)
A prescription was sent in for diclofenac.

## 2016-12-12 NOTE — Telephone Encounter (Signed)
Received fax request from [facility] to send refills for diclofenac sodium EC tab.

## 2017-01-12 ENCOUNTER — Ambulatory Visit: Payer: 59 | Admitting: Adult Health

## 2017-02-02 ENCOUNTER — Telehealth: Payer: Self-pay | Admitting: Neurology

## 2017-02-10 ENCOUNTER — Telehealth: Payer: Self-pay | Admitting: Neurology

## 2017-02-10 ENCOUNTER — Encounter: Payer: Self-pay | Admitting: Neurology

## 2017-02-10 ENCOUNTER — Ambulatory Visit (INDEPENDENT_AMBULATORY_CARE_PROVIDER_SITE_OTHER): Payer: 59 | Admitting: Neurology

## 2017-02-10 VITALS — BP 128/85 | HR 77 | Ht 70.0 in | Wt 135.0 lb

## 2017-02-10 DIAGNOSIS — G43019 Migraine without aura, intractable, without status migrainosus: Secondary | ICD-10-CM

## 2017-02-10 NOTE — Progress Notes (Signed)
Please refer to Botox procedure note. 

## 2017-02-10 NOTE — Procedures (Signed)
     BOTOX PROCEDURE NOTE FOR MIGRAINE HEADACHE   HISTORY: Sharon Osborn is a 41 year old patient with a history of intractable migraine headache. The patient comes in for her third Botox injection therapy. The patient had some worsening of the headache following her second injection, but over the last several weeks her headaches have been very well controlled. She returns for further therapy.   Description of procedure:  The patient was placed in a sitting position. The standard protocol was used for Botox as follows, with 5 units of Botox injected at each site:   -Procerus muscle, midline injection  -Corrugator muscle, bilateral injection  -Frontalis muscle, bilateral injection, with 2 sites each side, medial injection was performed in the upper one third of the frontalis muscle, in the region vertical from the medial inferior edge of the superior orbital rim. The lateral injection was again in the upper one third of the forehead vertically above the lateral limbus of the cornea, 1.5 cm lateral to the medial injection site.  -Temporalis muscle injection, 4 sites, bilaterally. The first injection was 3 cm above the tragus of the ear, second injection site was 1.5 cm to 3 cm up from the first injection site in line with the tragus of the ear. The third injection site was 1.5-3 cm forward between the first 2 injection sites. The fourth injection site was 1.5 cm posterior to the second injection site.  -Occipitalis muscle injection, 3 sites, bilaterally. The first injection was done one half way between the occipital protuberance and the tip of the mastoid process behind the ear. The second injection site was done lateral and superior to the first, 1 fingerbreadth from the first injection. The third injection site was 1 fingerbreadth superiorly and medially from the first injection site.  -Cervical paraspinal muscle injection, 2 sites, bilateral, the first injection site was 1 cm from the midline  of the cervical spine, 3 cm inferior to the lower border of the occipital protuberance. The second injection site was 1.5 cm superiorly and laterally to the first injection site.  -Trapezius muscle injection was performed at 3 sites, bilaterally. The first injection site was in the upper trapezius muscle halfway between the inflection point of the neck, and the acromion. The second injection site was one half way between the acromion and the first injection site. The third injection was done between the first injection site and the inflection point of the neck.   A 200 unit bottle of Botox was used, 155 units were injected, the rest of the Botox was wasted. The patient tolerated the procedure well, there were no complications of the above procedure.  Botox NDC [id number] Lot number [id number] Expiration date April 2021

## 2017-02-10 NOTE — Telephone Encounter (Signed)
Patient want's to to Judi Cong cuss her next Botox apt with and she needs to schedule . Please call her on Monday.

## 2017-02-24 NOTE — Telephone Encounter (Signed)
ERROR

## 2017-03-29 ENCOUNTER — Ambulatory Visit: Payer: 59 | Admitting: Adult Health

## 2017-03-29 ENCOUNTER — Encounter: Payer: Self-pay | Admitting: Adult Health

## 2017-03-29 ENCOUNTER — Ambulatory Visit (INDEPENDENT_AMBULATORY_CARE_PROVIDER_SITE_OTHER): Payer: 59 | Admitting: Adult Health

## 2017-03-29 VITALS — BP 118/72 | HR 81 | Wt 137.0 lb

## 2017-03-29 DIAGNOSIS — G43009 Migraine without aura, not intractable, without status migrainosus: Secondary | ICD-10-CM | POA: Diagnosis not present

## 2017-03-29 MED ORDER — FROVATRIPTAN SUCCINATE 2.5 MG PO TABS: ORAL_TABLET | ORAL | 0 refills | Status: DC

## 2017-03-29 NOTE — Progress Notes (Signed)
PATIENT: Sharon Osborn DOB: [birth year]  REASON FOR VISIT: follow up-migraine headaches HISTORY FROM: patient  HISTORY OF PRESENT ILLNESS: Today 03/29/17 Sharon Osborn is a 41 year old female with a history of intractable migraine headaches.  She returns today for follow-up.  She is been receiving Botox injections.  She reports that she has noticed that the Botox injections have been beneficial for her migraines.  She states her frequency has decreased some but the severity has definitely improved.  She states that she is now functional during her migraines whereas before she could not function.  She states that her headaches are the worst when she is ovulating and during her menstrual cycle.  She reports that headaches tend to occur daily during this time.  She states Maxalt and diclofenac are beneficial but typically the headache returns the next day.  She reports most of her headaches occur in the left temporal region.  She does have photophobia and phonophobia as well as nausea.  She returns today for an evaluation.  HISTORY Sharon Osborn is a 41 year old female with a history of intractable migraines. She returns today for follow-up. She reports that she was on Zonegran was stopped medication in January. She states it made her feel "spaced out." She also that the flu and kidney stones in January and never restarted the medication. She states that she has approximately 17 headache days a month. Her headaches always occur in the right temporal region. She does have photophobia, phonophobia, nausea and vomiting. She states that she will usually see stars in her periphery before the headache starts. She reports that she does get some benefit from Maxalt but is not working as well as it was. She has tried diclofenac, Zonegran, Maxalt, Imitrex and Inderal with limited or no benefit. She returns today for an evaluation.  HISTORY 03/16/16 (Sharon Osborn): Sharon Osborn a 41 year old right-handed white femalewith a  history of intractable migraine. The patient has had migraine headaches off and on since age 48. The patient had some increase in frequency when she was in college, but during her childbearing years, she really had no headaches. The patient over the last year has begun having increased frequency of headache, but over the last 6 months the headaches have become very frequent with on average 16 headache days a month. The patient may have 7 days of headache that begin just prior to her menstrual cycle, and continue on. She may go 3 days without headache, and then get another headache lasting about a week. The patient has multiple activators for her headache that include stress, alcohol intake, chocolate, certain odors such as gasoline or perfumes, bright light, salty foods may also bring on headache. The patient has been seen through [facility], she was placed on Inderal in low dose taking 10 mg daily. The patient could not tolerate even this low dose, and she had to stop the medication. The patient has been taking Maxalt and diclofenac for the headache with some benefit, but she is requiring large amounts of these medications. The patient may have scintillating scotoma prior to the onset of the headache, she may also have some cognitive slowing and slurred speech before the headache begins. She denies any numbness or weakness with the headache, she does have nausea and vomiting and photophobia and phonophobia. The headache may last several hours, sleep does not help the headache. The patient is having difficulty keeping up with her work because of the headache. She comes to this office for an evaluation.  The headache is usually in theright greater than left temporal regions.  REVIEW OF SYSTEMS: Out of a complete 14 system review of symptoms, the patient complains only of the following symptoms, and all other reviewed systems are negative.  ALLERGIES: Allergies  Allergen Reactions  . Amoxicillin Hives    . Inderal [Propranolol] Other (See Comments)    Fainting spells    HOME MEDICATIONS: Outpatient Medications Prior to Visit  Medication Sig Dispense Refill  . amphetamine-dextroamphetamine (ADDERALL) 20 MG tablet Take 20 mg by mouth 3 (three) times daily.     Marland Kitchen. buPROPion (WELLBUTRIN SR) 150 MG 12 hr tablet Take 150 mg by mouth 2 (two) times daily.     . diclofenac (VOLTAREN) 75 MG EC tablet Take 1 tablet (75 mg total) by mouth 2 (two) times daily as needed. 180 tablet 1  . rizatriptan (MAXALT-MLT) 10 MG disintegrating tablet Take 10 mg by mouth.     No facility-administered medications prior to visit.     PAST MEDICAL HISTORY: Past Medical History:  Diagnosis Date  . ADHD   . Common migraine with intractable migraine 03/16/2016  . High cholesterol   . Migraine     PAST SURGICAL HISTORY: Past Surgical History:  Procedure Laterality Date  . BREAST ENHANCEMENT SURGERY  2015    FAMILY HISTORY: Family History  Problem Relation Age of Onset  . Migraines Mother   . Heart disease Mother   . High Cholesterol Mother   . Breast cancer Mother   . Heart disease Father   . High Cholesterol Father   . Migraines Sister   . Seizures Sister   . Parkinson's disease Maternal Grandmother   . Heart disease Maternal Grandfather   . Heart disease Paternal Grandfather   . High Cholesterol Paternal Grandfather   . Migraines Sister     SOCIAL HISTORY: Social History   Socioeconomic History  . Marital status: Married    Spouse name: Not on file  . Number of children: 2  . Years of education: Grad school  . Highest education level: Not on file  Social Needs  . Financial resource strain: Not on file  . Food insecurity - worry: Not on file  . Food insecurity - inability: Not on file  . Transportation needs - medical: Not on file  . Transportation needs - non-medical: Not on file  Occupational History  . Occupation: [employer]  Tobacco Use  . Smoking status: Former Smoker     Last attempt to quit: 03/17/2007    Years since quitting: 10.0  . Smokeless tobacco: Never Used  Substance and Sexual Activity  . Alcohol use: No    Comment: 2010  . Drug use: No  . Sexual activity: Not on file    Comment: Married  Other Topics Concern  . Not on file  Social History Narrative   Lives at home w/ her husband and 2 sons   Right-handed   Caffeine: 4-6 cups of coffee per day      PHYSICAL EXAM  Vitals:   03/29/17 0815  BP: 118/72  Pulse: 81  Weight: 137 lb (62.1 kg)   Body mass index is 19.66 kg/m.  Generalized: Well developed, in no acute distress   Neurological examination  Mentation: Alert oriented to time, place, history taking. Follows all commands speech and language fluent Cranial nerve II-XII: Pupils were equal round reactive to light. Extraocular movements were full, visual field were full on confrontational test. Facial sensation and strength were normal.  Uvula tongue midline. Head turning and shoulder shrug  were normal and symmetric. Motor: The motor testing reveals 5 over 5 strength of all 4 extremities. Good symmetric motor tone is noted throughout.  Sensory: Sensory testing is intact to soft touch on all 4 extremities. No evidence of extinction is noted.  Coordination: Cerebellar testing reveals good finger-nose-finger and heel-to-shin bilaterally.  Gait and station: Gait is normal. Tandem gait is normal. Romberg is negative. No drift is seen.  Reflexes: Deep tendon reflexes are symmetric and normal bilaterally.   DIAGNOSTIC DATA (LABS, IMAGING, TESTING) - I reviewed patient records, labs, notes, testing and imaging myself where available.     ASSESSMENT AND PLAN 41 y.o. year old female  has a past medical history of ADHD, Common migraine with intractable migraine (03/16/2016), High cholesterol, and Migraine. here with :   1.  Migraine headaches  The patient will continue Botox injections with Dr. Anne HahnWillis.  After discussion with Dr.  Anne HahnWillis.  I have recommended that she start Frovatriptan.  She will take 2.5 mg twice a day for 3 days starting on day 1 of her menstrual cycle.  She is advised that she should avoid using Maxalt while using Frovatriptan.  She voiced understanding. Her next Botox injection is scheduled for next month.  She will follow-up in 6 months or sooner if needed   Butch PennyMegan Jayden Kratochvil, MSN, NP-C 03/29/2017, 8:14 AM [facility] [address], [address] [place], [place] [id number] [phone]

## 2017-03-29 NOTE — Patient Instructions (Signed)
Your Plan:  Continue botox Use Frova: Take 2.5 mg twice a day for 3 days beginning day 1 of menstrual cycle. Do not take frova and Maxalt together  If your symptoms worsen or you develop new symptoms please let us know.   Thank you for coming to [facility] at [facility]. I hope we have been able to provide you high quality care today.  You may receive a patient satisfaction survey over the next few weeks. We would appreciate your feedback and comments so that we may continue to improve ourselves and the health of our patients.  Frovatriptan tablets What is this medicine? FROVATRIPTAN (froe va TRIP tan) is used to treat migraines with or without aura. An aura is a strange feeling or visual disturbance that warns you of an attack. It is not used to prevent migraines. This medicine may be used for other purposes; ask your health care provider or pharmacist if you have questions. COMMON BRAND NAME(S): Frova What should I tell my health care provider before I take this medicine? They need to know if you have any of these conditions: -bowel disease or colitis -diabetes -family history of heart disease -fast or irregular heart beat -heart or blood vessel disease, angina (chest pain), or previous heart attack -high blood pressure -high cholesterol -history of stroke, transient ischemic attacks (TIAs or mini-strokes), or intracranial bleeding -kidney or liver disease -overweight -poor circulation -postmenopausal or surgical removal of uterus and ovaries -Raynaud's disease -seizure disorder -an unusual or allergic reaction to frovatriptan, other medicines, foods, dyes, or preservatives -pregnant or trying to get pregnant -breast-feeding How should I use this medicine? Take this medicine by mouth with a glass of water. Follow the directions on the prescription label. This medicine is taken at the first symptoms of a migraine. It is not for everyday use. If your migraine  headache returns after one dose, you can take another dose as directed. You must allow at least 2 hours between doses, and do not take more than 3 tablets (7.5 mg) in 24 hours. If there is no improvement at all after the first dose, do not take a second dose without talking to your doctor or health care professional. Do not take your medicine more often than directed. Talk to your pediatrician regarding the use of this medicine in children. Special care may be needed. Overdosage: If you think you have taken too much of this medicine contact a poison control center or emergency room at once. NOTE: This medicine is only for you. Do not share this medicine with others. What if I miss a dose? This does not apply; this medicine is not for regular use. What may interact with this medicine? Do not take this medicine with any of the following medicines: -amphetamine, dextroamphetamine or cocaine -dihydroergotamine, ergotamine, ergoloid mesylates, methysergide, or ergot-type medication - do not take within 24 hours of taking frovatriptan -feverfew -MAOIs like Carbex, Eldepryl, Marplan, Nardil, and Parnate - do not take frovatriptan within 2 weeks of stopping MAOI therapy -other migraine medicines like almotriptan, eletriptan, naratriptan, rizatriptan, zolmitriptan - do not take within 24 hours of taking frovatriptan -tryptophan This medicine may also interact with the following medications: -female hormones, like estrogens or progestins and birth control pills -medicines for mental depression, anxiety or mood problems -propranolol This list may not describe all possible interactions. Give your health care provider a list of all the medicines, herbs, non-prescription drugs, or dietary supplements you use. Also tell them if you smoke,  drink alcohol, or use illegal drugs. Some items may interact with your medicine. What should I watch for while using this medicine? Only take this medicine for a migraine  headache. Take it if you get warning symptoms or at the start of a migraine attack. It is not for regular use to prevent migraine attacks. You may get drowsy or dizzy. Do not drive, use machinery, or do anything that needs mental alertness until you know how this medicine affects you. To reduce dizzy or fainting spells, do not sit or stand up quickly, especially if you are an older patient. Alcohol can increase drowsiness, dizziness and flushing. Avoid alcoholic drinks. Smoking cigarettes may increase the risk of heart-related side effects from using this medicine. If you take migraine medicines for 10 or more days a month, your migraines may get worse. Keep a diary of headache days and medicine use. Contact your healthcare professional if your migraine attacks occur more frequently. What side effects may I notice from receiving this medicine? Side effects that you should report to your doctor or health care professional as soon as possible: -allergic reactions like skin rash, itching or hives, swelling of the face, lips, or tongue -chest or throat pain, tightness -fast, slow, or irregular heart beat -increased or decreased blood pressure -loss of vision or vision changes -seizures -severe stomach pain and cramping, bloody diarrhea -shortness of breath, wheezing, or difficulty breathing -tingling, pain, or numbness in the face, hands or feet Side effects that usually do not require medical attention (report to your doctor or health care professional if they continue or are bothersome): -drowsiness -feeling warm, flushing, or redness of the face -headache -muscle pain or cramps -nausea, vomiting, diarrhea or stomach upset -tiredness or weakness This list may not describe all possible side effects. Call your doctor for medical advice about side effects. You may report side effects to FDA at 1-800-FDA-1088. Where should I keep my medicine? Keep out of the reach of children. Store at room  temperature between 20 and 25 degrees C (68 and 77 degrees F). Protect from light and moisture. Throw away any unused medicine after the expiration date. NOTE: This sheet is a summary. It may not cover all possible information. If you have questions about this medicine, talk to your doctor, pharmacist, or health care provider.  2018 Elsevier/Gold Standard (2013-01-08 10:06:08)

## 2017-03-29 NOTE — Progress Notes (Signed)
I have read the note, and I agree with the clinical assessment and plan.  Jasyn Mey KEITH   

## 2017-05-08 ENCOUNTER — Ambulatory Visit: Payer: 59 | Admitting: Neurology

## 2017-05-08 ENCOUNTER — Telehealth: Payer: Self-pay | Admitting: Neurology

## 2017-05-08 ENCOUNTER — Telehealth: Payer: Self-pay | Admitting: Adult Health

## 2017-05-08 NOTE — Telephone Encounter (Signed)
Prescription needs verification for pharmacy.

## 2017-05-08 NOTE — Telephone Encounter (Signed)
See other phone note

## 2017-05-08 NOTE — Telephone Encounter (Signed)
Pt. botox needs verification for pharmacy.

## 2017-05-10 ENCOUNTER — Ambulatory Visit (INDEPENDENT_AMBULATORY_CARE_PROVIDER_SITE_OTHER): Payer: 59 | Admitting: Neurology

## 2017-05-10 ENCOUNTER — Encounter: Payer: Self-pay | Admitting: Neurology

## 2017-05-10 VITALS — BP 138/93 | HR 72 | Ht 70.0 in | Wt 139.0 lb

## 2017-05-10 DIAGNOSIS — G43019 Migraine without aura, intractable, without status migrainosus: Secondary | ICD-10-CM | POA: Diagnosis not present

## 2017-05-10 NOTE — Telephone Encounter (Signed)
This patients botox is scheduled for today 12/19. Her medication is already in office. I called the patient and spoke with her regarding her questions. She stated that the pharmacy called last week and asked her to call them back, they were probably just asking for consent to ship. No verification is needed on our end. I made the patient aware the her medication was already in office.

## 2017-05-10 NOTE — Procedures (Signed)
     BOTOX PROCEDURE NOTE FOR MIGRAINE HEADACHE   HISTORY: Sharon Osborn is a 41 year old patient with a history of intractable migraines.  The patient has gained excellent benefit with the Botox.  Her headache frequency becomes almost negligible following the Botox injection, her headaches become more frequent within 2 or 3 weeks prior to the next injection.  The patient returns for a Botox therapy.   Description of procedure:  The patient was placed in a sitting position. The standard protocol was used for Botox as follows, with 5 units of Botox injected at each site:   -Procerus muscle, midline injection  -Corrugator muscle, bilateral injection  -Frontalis muscle, bilateral injection, with 2 sites each side, medial injection was performed in the upper one third of the frontalis muscle, in the region vertical from the medial inferior edge of the superior orbital rim. The lateral injection was again in the upper one third of the forehead vertically above the lateral limbus of the cornea, 1.5 cm lateral to the medial injection site.  -Temporalis muscle injection, 4 sites, bilaterally. The first injection was 3 cm above the tragus of the ear, second injection site was 1.5 cm to 3 cm up from the first injection site in line with the tragus of the ear. The third injection site was 1.5-3 cm forward between the first 2 injection sites. The fourth injection site was 1.5 cm posterior to the second injection site.  -Occipitalis muscle injection, 3 sites, bilaterally. The first injection was done one half way between the occipital protuberance and the tip of the mastoid process behind the ear. The second injection site was done lateral and superior to the first, 1 fingerbreadth from the first injection. The third injection site was 1 fingerbreadth superiorly and medially from the first injection site.  -Cervical paraspinal muscle injection, 2 sites, bilateral, the first injection site was 1 cm from the  midline of the cervical spine, 3 cm inferior to the lower border of the occipital protuberance. The second injection site was 1.5 cm superiorly and laterally to the first injection site.  -Trapezius muscle injection was performed at 3 sites, bilaterally. The first injection site was in the upper trapezius muscle halfway between the inflection point of the neck, and the acromion. The second injection site was one half way between the acromion and the first injection site. The third injection was done between the first injection site and the inflection point of the neck.   A 200 unit bottle of Botox was used, 155 units were injected, the rest of the Botox was wasted. The patient tolerated the procedure well, there were no complications of the above procedure.  Botox NDC [id number] Lot number [id number] Expiration date April 2021

## 2017-05-10 NOTE — Progress Notes (Signed)
Please refer to Botox procedure note. 

## 2017-05-12 ENCOUNTER — Ambulatory Visit: Payer: 59 | Admitting: Neurology

## 2017-05-17 ENCOUNTER — Other Ambulatory Visit: Payer: Self-pay | Admitting: Neurology

## 2017-05-18 ENCOUNTER — Telehealth: Payer: Self-pay | Admitting: Neurology

## 2017-05-18 MED ORDER — DICLOFENAC SODIUM 75 MG PO TBEC: 75.0000 mg | DELAYED_RELEASE_TABLET | Freq: Two times a day (BID) | ORAL | 1 refills | Status: DC | PRN

## 2017-05-18 NOTE — Telephone Encounter (Signed)
Pt states that re: her diclofenac (VOLTAREN) 75 MG EC tablet she has not had it filled since 02-01-2017 and it was for 60 pills 1 twice daily, she is asking for a call to know if this can be filled

## 2017-05-18 NOTE — Telephone Encounter (Signed)
The diclofenac prescription will be refilled.

## 2017-05-18 NOTE — Addendum Note (Signed)
Addended by: York SpanielWILLIS, Mathis Cashman K on: 05/18/2017 11:48 AM   Modules accepted: Orders

## 2017-07-12 ENCOUNTER — Other Ambulatory Visit: Payer: Self-pay | Admitting: Neurology

## 2017-07-13 ENCOUNTER — Other Ambulatory Visit: Payer: Self-pay

## 2017-07-13 MED ORDER — RIZATRIPTAN BENZOATE 10 MG PO TBDP: 10.0000 mg | ORAL_TABLET | ORAL | 6 refills | Status: DC | PRN

## 2017-08-01 ENCOUNTER — Telehealth: Payer: Self-pay | Admitting: Neurology

## 2017-08-01 NOTE — Telephone Encounter (Signed)
Initiated quantity limit exception on covermymeds: Key: [id number]. In process of completing.

## 2017-08-01 NOTE — Telephone Encounter (Signed)
Patient calling and wants to know why she can only get #4 rizatriptan (MAXALT-MLT) 10 MG disintegrating tablet. She has an appointment with Dr. Anne HahnWillis 08-09-17.

## 2017-08-01 NOTE — Telephone Encounter (Signed)
[facility]. Spoke with Genworth FinancialMegan. Patient picked up refill recently. Only shows Max qty:4. Does not specify if this is per refill or month. She will fax rejection to [phone] for me to see.  Rxbin: [id number] [id number] Group:[id number] NF#[id number]

## 2017-08-02 ENCOUNTER — Telehealth: Payer: Self-pay | Admitting: Neurology

## 2017-08-02 NOTE — Telephone Encounter (Signed)
I called [facility] to request refill I was unable to do the refill due to the patient needed to pay the copay. The copay was $1,119.30 and she didn't want to pay that but she was giving a number of [phone] to get assistance on paying that copay. Just waiting to see if the patient signs up for the assistance or not.

## 2017-08-02 NOTE — Telephone Encounter (Signed)
Called and LVM for patient. Relayed information that insurance does not allow quantity limit exception. She will have to pay out of pocket for 6 tablets that insurance will not cover to equal 10 tablets Dr. Anne HahnWillis prescribed.  Advised I called and spoke with Barbara CowerJason at her pharmacy who applied a discount card which brought the price from $46.16 to $16.41 for her. She can pick this up and pay out of pocket. Advised her to call back if she has further questions/concerns.

## 2017-08-02 NOTE — Telephone Encounter (Signed)
Received notification from [employer] that medication quantities above the benefit limit excluded under the plan. Cannot do a quantity limit exception.

## 2017-08-02 NOTE — Telephone Encounter (Signed)
Submitted PA on covermymeds. Waiting on determination.  "[employer] is reviewing your PA request. Typically an electronic response will be received within 72 hours". 

## 2017-08-07 ENCOUNTER — Telehealth: Payer: Self-pay | Admitting: Neurology

## 2017-08-07 NOTE — Telephone Encounter (Signed)
Noted, thank you

## 2017-08-07 NOTE — Telephone Encounter (Signed)
FYI Pt has called to inform Duwayne HeckDanielle that she spoke with [facility] and they are working on getting some type of assistance for pt and will be in touch by tomorrow morning.

## 2017-08-08 NOTE — Telephone Encounter (Signed)
Eric/[facility] [phone] confirmed delivery of botox for tomorrow.

## 2017-08-09 ENCOUNTER — Ambulatory Visit (INDEPENDENT_AMBULATORY_CARE_PROVIDER_SITE_OTHER): Payer: 59 | Admitting: Neurology

## 2017-08-09 ENCOUNTER — Encounter: Payer: Self-pay | Admitting: Neurology

## 2017-08-09 VITALS — BP 119/77 | HR 66

## 2017-08-09 DIAGNOSIS — G43019 Migraine without aura, intractable, without status migrainosus: Secondary | ICD-10-CM

## 2017-08-09 NOTE — Procedures (Signed)
     BOTOX PROCEDURE NOTE FOR MIGRAINE HEADACHE   HISTORY: Sharon Osborn is a 42 year old patient with a history of intractable migraine headaches.  She comes in for another Botox injection.  She continues to get excellent benefit with her headaches, the therapies markedly reduce the headache frequency and severity.  She may start getting headaches again within 3 weeks of the next injection.  She takes Maxalt for these headaches.  The headaches are no longer incapacitating for her.   Description of procedure:  The patient was placed in a sitting position. The standard protocol was used for Botox as follows, with 5 units of Botox injected at each site:   -Procerus muscle, midline injection  -Corrugator muscle, bilateral injection  -Frontalis muscle, bilateral injection, with 2 sites each side, medial injection was performed in the upper one third of the frontalis muscle, in the region vertical from the medial inferior edge of the superior orbital rim. The lateral injection was again in the upper one third of the forehead vertically above the lateral limbus of the cornea, 1.5 cm lateral to the medial injection site.  -Temporalis muscle injection, 4 sites, bilaterally. The first injection was 3 cm above the tragus of the ear, second injection site was 1.5 cm to 3 cm up from the first injection site in line with the tragus of the ear. The third injection site was 1.5-3 cm forward between the first 2 injection sites. The fourth injection site was 1.5 cm posterior to the second injection site.  -Occipitalis muscle injection, 3 sites, bilaterally. The first injection was done one half way between the occipital protuberance and the tip of the mastoid process behind the ear. The second injection site was done lateral and superior to the first, 1 fingerbreadth from the first injection. The third injection site was 1 fingerbreadth superiorly and medially from the first injection site.  -Cervical  paraspinal muscle injection, 2 sites, bilateral, the first injection site was 1 cm from the midline of the cervical spine, 3 cm inferior to the lower border of the occipital protuberance. The second injection site was 1.5 cm superiorly and laterally to the first injection site.  -Trapezius muscle injection was performed at 3 sites, bilaterally. The first injection site was in the upper trapezius muscle halfway between the inflection point of the neck, and the acromion. The second injection site was one half way between the acromion and the first injection site. The third injection was done between the first injection site and the inflection point of the neck.   A 200 unit bottle of Botox was used, 155 units were injected, the rest of the Botox was wasted. The patient tolerated the procedure well, there were no complications of the above procedure.  Botox NDC [id number] Lot number [id number] Expiration date September 2021

## 2017-08-09 NOTE — Telephone Encounter (Signed)
Noted, thank you so much.

## 2017-08-09 NOTE — Progress Notes (Signed)
Please refer to Botox procedure note. 

## 2017-10-02 ENCOUNTER — Encounter: Payer: Self-pay | Admitting: Adult Health

## 2017-10-02 ENCOUNTER — Ambulatory Visit (INDEPENDENT_AMBULATORY_CARE_PROVIDER_SITE_OTHER): Payer: 59 | Admitting: Adult Health

## 2017-10-02 VITALS — BP 128/83 | HR 96 | Ht 70.0 in | Wt 140.8 lb

## 2017-10-02 DIAGNOSIS — G43009 Migraine without aura, not intractable, without status migrainosus: Secondary | ICD-10-CM | POA: Diagnosis not present

## 2017-10-02 NOTE — Progress Notes (Signed)
PATIENT: Sharon Osborn DOB: 1975-11-16  REASON FOR VISIT: follow up HISTORY FROM: patient  HISTORY OF PRESENT ILLNESS: Today 10/02/17 [name] is a 42 year old female with a history of intractable migraine headaches.  She returns today for follow-up.  She reports that with Botox injections her migraine frequency and severity have improved.  She states that she typically gets headaches around ovulation and menstrual cycle.  She states that she can take half a tablet of rizatriptan or Voltaren and her headache resolves fairly quickly.  She states her headaches continue to be left-sided with photophobia and phonophobia.  She reports that the smell of gasoline also triggers her headaches.  At this time she feels that her headaches are manageable.  We did order frovatriptan however insurance did not cover this.  She returns today for an evaluation.  HISTORY 03/29/17 [name] is a 42 year old female with a history of intractable migraine headaches.  She returns today for follow-up.  She is been receiving Botox injections.  She reports that she has noticed that the Botox injections have been beneficial for her migraines.  She states her frequency has decreased some but the severity has definitely improved.  She states that she is now functional during her migraines whereas before she could not function.  She states that her headaches are the worst when she is ovulating and during her menstrual cycle.  She reports that headaches tend to occur daily during this time.  She states Maxalt and diclofenac are beneficial but typically the headache returns the next day.  She reports most of her headaches occur in the left temporal region.  She does have photophobia and phonophobia as well as nausea.  She returns today for an evaluation.   REVIEW OF SYSTEMS: Out of a complete 14 system review of symptoms, the patient complains only of the following symptoms, and all other reviewed systems are negative.  See  HPI   ALLERGIES: Allergies  Allergen Reactions  . Iodinated Diagnostic Agents Hives    Rash.   . Amoxicillin Hives  . Inderal [Propranolol] Other (See Comments)    Fainting spells    HOME MEDICATIONS: Outpatient Medications Prior to Visit  Medication Sig Dispense Refill  . amphetamine-dextroamphetamine (ADDERALL) 20 MG tablet Take 20 mg by mouth 3 (three) times daily.     Marland Kitchen buPROPion (WELLBUTRIN SR) 150 MG 12 hr tablet Take 150 mg by mouth 2 (two) times daily.     . diclofenac (VOLTAREN) 75 MG EC tablet Take 1 tablet (75 mg total) by mouth 2 (two) times daily as needed. 180 tablet 1  . frovatriptan (FROVA) 2.5 MG tablet Take 2.5 mg twice a day for 3 days beginning day 1 of menstrual cycle. 10 tablet 0  . ondansetron (ZOFRAN-ODT) 8 MG disintegrating tablet as needed.    . rizatriptan (MAXALT-MLT) 10 MG disintegrating tablet Take 1 tablet (10 mg total) by mouth as needed for migraine. 10 tablet 6   No facility-administered medications prior to visit.     PAST MEDICAL HISTORY: Past Medical History:  Diagnosis Date  . ADHD   . Common migraine with intractable migraine 03/16/2016  . High cholesterol   . Kidney stones 2018  . Migraine     PAST SURGICAL HISTORY: Past Surgical History:  Procedure Laterality Date  . BREAST ENHANCEMENT SURGERY  2015    FAMILY HISTORY: Family History  Problem Relation Age of Onset  . Migraines Mother   . Heart disease Mother   . High  Cholesterol Mother   . Breast cancer Mother   . Kidney Stones Mother   . Heart disease Father   . High Cholesterol Father   . Migraines Sister   . Seizures Sister   . Kidney Stones Sister   . Parkinson's disease Maternal Grandmother   . Heart disease Maternal Grandfather   . Heart disease Paternal Grandfather   . High Cholesterol Paternal Grandfather   . Migraines Sister     SOCIAL HISTORY: Social History   Socioeconomic History  . Marital status: Married    Spouse name: Not on file  . Number of  children: 2  . Years of education: Grad school  . Highest education level: Not on file  Occupational History  . Occupation: [employer]  Social Needs  . Financial resource strain: Not on file  . Food insecurity:    Worry: Not on file    Inability: Not on file  . Transportation needs:    Medical: Not on file    Non-medical: Not on file  Tobacco Use  . Smoking status: Former Smoker    Last attempt to quit: 03/17/2007    Years since quitting: 10.5  . Smokeless tobacco: Never Used  Substance and Sexual Activity  . Alcohol use: No    Comment: 2010  . Drug use: No  . Sexual activity: Not on file    Comment: Married  Lifestyle  . Physical activity:    Days per week: Not on file    Minutes per session: Not on file  . Stress: Not on file  Relationships  . Social connections:    Talks on phone: Not on file    Gets together: Not on file    Attends religious service: Not on file    Active member of club or organization: Not on file    Attends meetings of clubs or organizations: Not on file    Relationship status: Not on file  . Intimate partner violence:    Fear of current or ex partner: Not on file    Emotionally abused: Not on file    Physically abused: Not on file    Forced sexual activity: Not on file  Other Topics Concern  . Not on file  Social History Narrative   Lives at home w/ her husband and 2 sons   Right-handed   Caffeine: 4-6 cups of coffee per day      PHYSICAL EXAM  Vitals:   10/02/17 0842  BP: 128/83  Pulse: 96  Weight: 140 lb 12.8 oz (63.9 kg)  Height:  (1.778 m)   Body mass index is 20.2 kg/m.  Generalized: Well developed, in no acute distress   Neurological examination  Mentation: Alert oriented to time, place, history taking. Follows all commands speech and language fluent Cranial nerve II-XII: Pupils were equal round reactive to light. Extraocular movements were full, visual field were full on confrontational test. Facial  sensation and strength were normal. Uvula tongue midline. Head turning and shoulder shrug  were normal and symmetric. Motor: The motor testing reveals 5 over 5 strength of all 4 extremities. Good symmetric motor tone is noted throughout.  Sensory: Sensory testing is intact to soft touch on all 4 extremities. No evidence of extinction is noted.  Coordination: Cerebellar testing reveals good finger-nose-finger and heel-to-shin bilaterally.  Gait and station: Gait is normal. Tandem gait is normal. Romberg is negative. No drift is seen.  Reflexes: Deep tendon reflexes are symmetric and normal bilaterally.  DIAGNOSTIC DATA (LABS, IMAGING, TESTING) - I reviewed patient records, labs, notes, testing and imaging myself where available.  Lab Results  Component Value Date   WBC 8.3 02/09/2010   HGB 9.7 (L) 02/09/2010   HCT 28.4 (L) 02/09/2010   MCV 94.3 02/09/2010   PLT 179 02/09/2010      ASSESSMENT AND PLAN 42 y.o. year old female  has a past medical history of ADHD, Common migraine with intractable migraine (03/16/2016), High cholesterol, Kidney stones (2018), and Migraine. here with:  1.  Migraine headaches  Overall the patient is doing well.  She will continue onMaxalt and Voltaren.  She will continue with Botox injections.  She is advised that if her headache severity or frequency increases she should let us know.  She will follow-up in 1 year or sooner if needed.   I spent 15 minutes with the patient. 50% of this time was spent discussing her migraine frequency as well as treatment.   Butch Penny, MSN, NP-C 10/02/2017, 8:35 AM [facility] [address] [place], [place] [address] [phone]

## 2017-10-02 NOTE — Patient Instructions (Signed)
Your Plan:  Continue Botox Continue Maxalt and Voltaren  If your symptoms worsen or you develop new symptoms please let us know.   Thank you for coming to see [place] at [facility]. I hope we have been able to provide you high quality care today.  You may receive a patient satisfaction survey over the next few weeks. We would appreciate your feedback and comments so that we may continue to improve ourselves and the health of our patients.

## 2017-10-02 NOTE — Progress Notes (Signed)
I have read the note, and I agree with the clinical assessment and plan.  Matthan Sledge K English Tomer   

## 2017-10-24 ENCOUNTER — Telehealth: Payer: Self-pay | Admitting: Neurology

## 2017-10-24 NOTE — Telephone Encounter (Signed)
Patients Botox will be filled through [facility]. Authorization will be obtained through [facility]. Their phone number is [phone].

## 2017-10-31 NOTE — Telephone Encounter (Signed)
Medicine is here °

## 2017-11-15 ENCOUNTER — Encounter: Payer: Self-pay | Admitting: Neurology

## 2017-11-15 ENCOUNTER — Ambulatory Visit (INDEPENDENT_AMBULATORY_CARE_PROVIDER_SITE_OTHER): Payer: 59 | Admitting: Neurology

## 2017-11-15 VITALS — BP 133/84 | HR 75 | Ht 70.0 in

## 2017-11-15 DIAGNOSIS — G43019 Migraine without aura, intractable, without status migrainosus: Secondary | ICD-10-CM | POA: Diagnosis not present

## 2017-11-15 NOTE — Procedures (Signed)
     BOTOX PROCEDURE NOTE FOR MIGRAINE HEADACHE   HISTORY: Sharon Osborn is a 42 year old patient with a history of intractable migraine headaches.  She has done very well with Botox, for 2 months after the treatment she is almost headache free, she is able to function normally.  Within 3 weeks prior to the next injection the headaches come back again, they are intractable once again, she has difficulty managing.  She returns for a Botox therapy.   Description of procedure:  The patient was placed in a sitting position. The standard protocol was used for Botox as follows, with 5 units of Botox injected at each site:   -Procerus muscle, midline injection  -Corrugator muscle, bilateral injection  -Frontalis muscle, bilateral injection, with 2 sites each side, medial injection was performed in the upper one third of the frontalis muscle, in the region vertical from the medial inferior edge of the superior orbital rim. The lateral injection was again in the upper one third of the forehead vertically above the lateral limbus of the cornea, 1.5 cm lateral to the medial injection site.  -Temporalis muscle injection, 4 sites, bilaterally. The first injection was 3 cm above the tragus of the ear, second injection site was 1.5 cm to 3 cm up from the first injection site in line with the tragus of the ear. The third injection site was 1.5-3 cm forward between the first 2 injection sites. The fourth injection site was 1.5 cm posterior to the second injection site.  -Occipitalis muscle injection, 3 sites, bilaterally. The first injection was done one half way between the occipital protuberance and the tip of the mastoid process behind the ear. The second injection site was done lateral and superior to the first, 1 fingerbreadth from the first injection. The third injection site was 1 fingerbreadth superiorly and medially from the first injection site.  -Cervical paraspinal muscle injection, 2 sites,  bilateral, the first injection site was 1 cm from the midline of the cervical spine, 3 cm inferior to the lower border of the occipital protuberance. The second injection site was 1.5 cm superiorly and laterally to the first injection site.  -Trapezius muscle injection was performed at 3 sites, bilaterally. The first injection site was in the upper trapezius muscle halfway between the inflection point of the neck, and the acromion. The second injection site was one half way between the acromion and the first injection site. The third injection was done between the first injection site and the inflection point of the neck.   A 200 unit bottle of Botox was used, 155 units were injected, the rest of the Botox was wasted. The patient tolerated the procedure well, there were no complications of the above procedure.  Botox NDC [id number] Lot number [id number] Expiration date November 2021

## 2017-11-15 NOTE — Progress Notes (Signed)
Please refer to Botox procedure note. 

## 2018-01-15 ENCOUNTER — Other Ambulatory Visit: Payer: Self-pay | Admitting: Neurology

## 2018-02-07 ENCOUNTER — Telehealth: Payer: Self-pay | Admitting: Neurology

## 2018-02-07 NOTE — Telephone Encounter (Signed)
I called [facility] at [phone] to request a refill on the patients Botox medication. We scheduled it for delivery.

## 2018-02-16 ENCOUNTER — Ambulatory Visit: Payer: 59 | Admitting: Neurology

## 2018-02-21 ENCOUNTER — Ambulatory Visit (INDEPENDENT_AMBULATORY_CARE_PROVIDER_SITE_OTHER): Payer: 59 | Admitting: Neurology

## 2018-02-21 ENCOUNTER — Encounter: Payer: Self-pay | Admitting: Neurology

## 2018-02-21 VITALS — BP 114/75 | HR 72 | Ht 70.0 in | Wt 137.0 lb

## 2018-02-21 DIAGNOSIS — G43019 Migraine without aura, intractable, without status migrainosus: Secondary | ICD-10-CM

## 2018-02-21 NOTE — Progress Notes (Signed)
Please refer to Botox procedure note. 

## 2018-02-21 NOTE — Procedures (Signed)
     BOTOX PROCEDURE NOTE FOR MIGRAINE HEADACHE   HISTORY: Sharon Osborn is a 42 year old patient with a history of intractable migraine headache.  She has gained excellent benefit with the Botox, she will go to 1/2 months with minimal headache, within 2 weeks of the next injection the headaches returned and are fairly severe.  The patient returns for another Botox therapy session.   Description of procedure:  The patient was placed in a sitting position. The standard protocol was used for Botox as follows, with 5 units of Botox injected at each site:   -Procerus muscle, midline injection  -Corrugator muscle, bilateral injection  -Frontalis muscle, bilateral injection, with 2 sites each side, medial injection was performed in the upper one third of the frontalis muscle, in the region vertical from the medial inferior edge of the superior orbital rim. The lateral injection was again in the upper one third of the forehead vertically above the lateral limbus of the cornea, 1.5 cm lateral to the medial injection site.  -Temporalis muscle injection, 4 sites, bilaterally. The first injection was 3 cm above the tragus of the ear, second injection site was 1.5 cm to 3 cm up from the first injection site in line with the tragus of the ear. The third injection site was 1.5-3 cm forward between the first 2 injection sites. The fourth injection site was 1.5 cm posterior to the second injection site.  -Occipitalis muscle injection, 3 sites, bilaterally. The first injection was done one half way between the occipital protuberance and the tip of the mastoid process behind the ear. The second injection site was done lateral and superior to the first, 1 fingerbreadth from the first injection. The third injection site was 1 fingerbreadth superiorly and medially from the first injection site.  -Cervical paraspinal muscle injection, 2 sites, bilateral, the first injection site was 1 cm from the midline of the  cervical spine, 3 cm inferior to the lower border of the occipital protuberance. The second injection site was 1.5 cm superiorly and laterally to the first injection site.  -Trapezius muscle injection was performed at 3 sites, bilaterally. The first injection site was in the upper trapezius muscle halfway between the inflection point of the neck, and the acromion. The second injection site was one half way between the acromion and the first injection site. The third injection was done between the first injection site and the inflection point of the neck.   A 200 unit bottle of Botox was used, 155 units were injected, the rest of the Botox was wasted. The patient tolerated the procedure well, there were no complications of the above procedure.  Botox NDC 1610-9604-54 Lot number [id number] Expiration date October 2021

## 2018-03-16 ENCOUNTER — Ambulatory Visit: Payer: 59 | Admitting: Neurology

## 2018-03-16 ENCOUNTER — Encounter

## 2018-05-30 ENCOUNTER — Encounter: Payer: Self-pay | Admitting: Neurology

## 2018-05-30 ENCOUNTER — Ambulatory Visit (INDEPENDENT_AMBULATORY_CARE_PROVIDER_SITE_OTHER): Payer: 59 | Admitting: Neurology

## 2018-05-30 VITALS — BP 127/84 | HR 72

## 2018-05-30 DIAGNOSIS — G43019 Migraine without aura, intractable, without status migrainosus: Secondary | ICD-10-CM | POA: Diagnosis not present

## 2018-05-30 NOTE — Procedures (Signed)
     BOTOX PROCEDURE NOTE FOR MIGRAINE HEADACHE   HISTORY: Sharon Osborn is a 43 year old patient with a history of intractable migraine headache.  The patient has gained excellent benefit with Botox, her headaches reduced dramatically for about 2 and half months following the Botox injections, she has gotten a consistent positive response from treatment.  She returns for another Botox therapy.   Description of procedure:  The patient was placed in a sitting position. The standard protocol was used for Botox as follows, with 5 units of Botox injected at each site:   -Procerus muscle, midline injection  -Corrugator muscle, bilateral injection  -Frontalis muscle, bilateral injection, with 2 sites each side, medial injection was performed in the upper one third of the frontalis muscle, in the region vertical from the medial inferior edge of the superior orbital rim. The lateral injection was again in the upper one third of the forehead vertically above the lateral limbus of the cornea, 1.5 cm lateral to the medial injection site.  -Temporalis muscle injection, 4 sites, bilaterally. The first injection was 3 cm above the tragus of the ear, second injection site was 1.5 cm to 3 cm up from the first injection site in line with the tragus of the ear. The third injection site was 1.5-3 cm forward between the first 2 injection sites. The fourth injection site was 1.5 cm posterior to the second injection site.  -Occipitalis muscle injection, 3 sites, bilaterally. The first injection was done one half way between the occipital protuberance and the tip of the mastoid process behind the ear. The second injection site was done lateral and superior to the first, 1 fingerbreadth from the first injection. The third injection site was 1 fingerbreadth superiorly and medially from the first injection site.  -Cervical paraspinal muscle injection, 2 sites, bilateral, the first injection site was 1 cm from the  midline of the cervical spine, 3 cm inferior to the lower border of the occipital protuberance. The second injection site was 1.5 cm superiorly and laterally to the first injection site.  -Trapezius muscle injection was performed at 3 sites, bilaterally. The first injection site was in the upper trapezius muscle halfway between the inflection point of the neck, and the acromion. The second injection site was one half way between the acromion and the first injection site. The third injection was done between the first injection site and the inflection point of the neck.   A 200 unit bottle of Botox was used, 155 units were injected, the rest of the Botox was wasted. The patient tolerated the procedure well, there were no complications of the above procedure.  Botox NDC 0272-5366-44 Lot number [id number] Expiration date May 2022

## 2018-06-26 ENCOUNTER — Other Ambulatory Visit: Payer: Self-pay | Admitting: Neurology

## 2018-07-11 ENCOUNTER — Other Ambulatory Visit: Payer: Self-pay

## 2018-07-11 MED ORDER — ONABOTULINUMTOXINA 100 UNITS IJ SOLR: 155.0000 [IU] | Freq: Once | INTRAMUSCULAR | 0 refills | Status: DC

## 2018-08-03 ENCOUNTER — Other Ambulatory Visit: Payer: Self-pay | Admitting: Neurology

## 2018-08-15 ENCOUNTER — Telehealth: Payer: Self-pay | Admitting: Neurology

## 2018-08-15 NOTE — Telephone Encounter (Signed)
I called [facility] [phone] to schedule delivery of the patients medication. The patient had a high copay and denied paying it at the time, they are going to work on getting her some financial assistance.

## 2018-08-21 NOTE — Telephone Encounter (Signed)
[facility] called and stated delivery 08/30/2018

## 2018-09-03 ENCOUNTER — Ambulatory Visit: Payer: 59 | Admitting: Neurology

## 2018-09-03 ENCOUNTER — Telehealth: Payer: Self-pay

## 2018-09-03 NOTE — Telephone Encounter (Signed)
I contacted the pt in regards to her botox appt on 09/05/18 and pt was ok to with pushing her visit out to 09/20/18 at 2 pm. Pt states she will call if between now and then her migraines increase or worsen.

## 2018-09-05 ENCOUNTER — Ambulatory Visit: Payer: 59 | Admitting: Neurology

## 2018-09-20 ENCOUNTER — Encounter: Payer: Self-pay | Admitting: Neurology

## 2018-09-20 ENCOUNTER — Ambulatory Visit (INDEPENDENT_AMBULATORY_CARE_PROVIDER_SITE_OTHER): Payer: 59 | Admitting: Neurology

## 2018-09-20 ENCOUNTER — Other Ambulatory Visit: Payer: Self-pay

## 2018-09-20 VITALS — BP 129/80 | HR 84 | Temp 98.9°F | Wt 135.0 lb

## 2018-09-20 DIAGNOSIS — G43019 Migraine without aura, intractable, without status migrainosus: Secondary | ICD-10-CM | POA: Diagnosis not present

## 2018-09-20 NOTE — Procedures (Signed)
     BOTOX PROCEDURE NOTE FOR MIGRAINE HEADACHE   HISTORY: Sharon Osborn is a 43 year old patient with a history of migraine headaches.  She is getting a very good response to Botox, her headaches start come back within 2 weeks before the next injection.  She returns for another Botox therapy.   Description of procedure:  The patient was placed in a sitting position. The standard protocol was used for Botox as follows, with 5 units of Botox injected at each site:   -Procerus muscle, midline injection  -Corrugator muscle, bilateral injection  -Frontalis muscle, bilateral injection, with 2 sites each side, medial injection was performed in the upper one third of the frontalis muscle, in the region vertical from the medial inferior edge of the superior orbital rim. The lateral injection was again in the upper one third of the forehead vertically above the lateral limbus of the cornea, 1.5 cm lateral to the medial injection site.  -Temporalis muscle injection, 4 sites, bilaterally. The first injection was 3 cm above the tragus of the ear, second injection site was 1.5 cm to 3 cm up from the first injection site in line with the tragus of the ear. The third injection site was 1.5-3 cm forward between the first 2 injection sites. The fourth injection site was 1.5 cm posterior to the second injection site.  -Occipitalis muscle injection, 3 sites, bilaterally. The first injection was done one half way between the occipital protuberance and the tip of the mastoid process behind the ear. The second injection site was done lateral and superior to the first, 1 fingerbreadth from the first injection. The third injection site was 1 fingerbreadth superiorly and medially from the first injection site.  -Cervical paraspinal muscle injection, 2 sites, bilateral, the first injection site was 1 cm from the midline of the cervical spine, 3 cm inferior to the lower border of the occipital protuberance. The second  injection site was 1.5 cm superiorly and laterally to the first injection site.  -Trapezius muscle injection was performed at 3 sites, bilaterally. The first injection site was in the upper trapezius muscle halfway between the inflection point of the neck, and the acromion. The second injection site was one half way between the acromion and the first injection site. The third injection was done between the first injection site and the inflection point of the neck.   A 200 unit bottle of Botox was used, 155 units were injected, the rest of the Botox was wasted. The patient tolerated the procedure well, there were no complications of the above procedure.  Botox NDC 2440-1027-25 Lot number [id number] Expiration date September 2022

## 2018-10-08 ENCOUNTER — Telehealth: Payer: Self-pay

## 2018-10-08 NOTE — Telephone Encounter (Signed)
[place] patient called back Im sorry I dont have Megan's Doxy[employer]me but I gave her instructions so you want have to call her she want's a text. [phone] T mobile

## 2018-10-08 NOTE — Telephone Encounter (Signed)
Unable to get in contact with the patient to convert their office visit with Megan on 10/10/2018 into a doxy.me visit. I left a voicemail asking the patient to return my call. Office number was provided.   If patient calls back please convert their office visit into a doxy.me visit.   

## 2018-10-08 NOTE — Telephone Encounter (Signed)
Text has been sent to the patient with instructions on how to access her doxy.me visit.

## 2018-10-10 ENCOUNTER — Ambulatory Visit: Payer: 59 | Admitting: Adult Health

## 2018-10-10 ENCOUNTER — Other Ambulatory Visit: Payer: Self-pay

## 2018-10-10 ENCOUNTER — Encounter: Payer: Self-pay | Admitting: Adult Health

## 2018-10-10 ENCOUNTER — Ambulatory Visit (INDEPENDENT_AMBULATORY_CARE_PROVIDER_SITE_OTHER): Payer: 59 | Admitting: Adult Health

## 2018-10-10 DIAGNOSIS — G43009 Migraine without aura, not intractable, without status migrainosus: Secondary | ICD-10-CM

## 2018-10-10 MED ORDER — DICLOFENAC SODIUM 75 MG PO TBEC: DELAYED_RELEASE_TABLET | ORAL | 1 refills | Status: DC

## 2018-10-10 NOTE — Progress Notes (Signed)
I have read the note, and I agree with the clinical assessment and plan.  Orion Vandervort K Ewel Lona   

## 2018-10-10 NOTE — Progress Notes (Signed)
PATIENT: Sharon Osborn DOB: 1975-09-26  REASON FOR VISIT: follow up HISTORY FROM: patient  Virtual Visit via Video Note  I connected with Sharon Osborn on 10/10/18 at  1:00 PM EDT by a video enabled telemedicine application located remotely at [facility] and verified that I am speaking with the correct person using two identifiers who was located at their own home.   I discussed the limitations of evaluation and management by telemedicine and the availability of in person appointments. The patient expressed understanding and agreed to proceed.   PATIENT: Sharon Osborn DOB: 1976-04-29  REASON FOR VISIT: follow up HISTORY FROM: patient  HISTORY OF PRESENT ILLNESS: Today 10/10/18  Sharon Osborn is a 42 year old female with a history of migraine headaches.  She returns today for a virtual visit.  The patient recently had Botox injection April 30 with Dr. Anne Hahn.  She reports that her stress has increased since COVID-19.  Reports that she is trying to do school with her kids but also working from home.  She also states that pollen levels has also affected her migraines.  She states on average she has about 8-10 migraines a month whereas before COVID-19 she was having approximately 5 to 6 months.  In the past she has used  Maxalt and diclofenac.  She states that she no longer has any diclofenac tablets but they worked well for her in the past.  She returns today for a virtual visit  REVIEW OF SYSTEMS: Out of a complete 14 system review of symptoms, the patient complains only of the following symptoms, and all other reviewed systems are negative.  See HPI  ALLERGIES: Allergies  Allergen Reactions  . Iodinated Diagnostic Agents Hives    Rash.   . Amoxicillin Hives  . Inderal [Propranolol] Other (See Comments)    Fainting spells    HOME MEDICATIONS: Outpatient Medications Prior to Visit  Medication Sig Dispense Refill  . amphetamine-dextroamphetamine (ADDERALL)  20 MG tablet Take 20 mg by mouth 3 (three) times daily.     Marland Kitchen buPROPion (WELLBUTRIN SR) 150 MG 12 hr tablet Take 150 mg by mouth 2 (two) times daily.     . ondansetron (ZOFRAN-ODT) 8 MG disintegrating tablet as needed.    . rizatriptan (MAXALT-MLT) 10 MG disintegrating tablet PLACE ONE TABLET ON THE TONGUE AS NEEDED FOR MIGRAINE 10 tablet 0  . diclofenac (VOLTAREN) 75 MG EC tablet TAKE 1 TABLET BY MOUTH TWO  TIMES DAILY AS NEEDED 180 tablet 1   No facility-administered medications prior to visit.     PAST MEDICAL HISTORY: Past Medical History:  Diagnosis Date  . ADHD   . Common migraine with intractable migraine 03/16/2016  . High cholesterol   . Kidney stones 2018  . Migraine     PAST SURGICAL HISTORY: Past Surgical History:  Procedure Laterality Date  . BREAST ENHANCEMENT SURGERY  2015    FAMILY HISTORY: Family History  Problem Relation Age of Onset  . Migraines Mother   . Heart disease Mother   . High Cholesterol Mother   . Breast cancer Mother   . Kidney Stones Mother   . Heart disease Father   . High Cholesterol Father   . Migraines Sister   . Seizures Sister   . Kidney Stones Sister   . Parkinson's disease Maternal Grandmother   . Heart disease Maternal Grandfather   . Heart disease Paternal Grandfather   . High Cholesterol Paternal Grandfather   . Migraines Sister  SOCIAL HISTORY: Social History   Socioeconomic History  . Marital status: Married    Spouse name: Not on file  . Number of children: 2  . Years of education: Grad school  . Highest education level: Not on file  Occupational History  . Occupation: [employer]  Social Needs  . Financial resource strain: Not on file  . Food insecurity:    Worry: Not on file    Inability: Not on file  . Transportation needs:    Medical: Not on file    Non-medical: Not on file  Tobacco Use  . Smoking status: Former Smoker    Last attempt to quit: 03/17/2007    Years since quitting: 11.5  .  Smokeless tobacco: Never Used  Substance and Sexual Activity  . Alcohol use: No    Comment: 2010  . Drug use: No  . Sexual activity: Not on file    Comment: Married  Lifestyle  . Physical activity:    Days per week: Not on file    Minutes per session: Not on file  . Stress: Not on file  Relationships  . Social connections:    Talks on phone: Not on file    Gets together: Not on file    Attends religious service: Not on file    Active member of club or organization: Not on file    Attends meetings of clubs or organizations: Not on file    Relationship status: Not on file  . Intimate partner violence:    Fear of current or ex partner: Not on file    Emotionally abused: Not on file    Physically abused: Not on file    Forced sexual activity: Not on file  Other Topics Concern  . Not on file  Social History Narrative   Lives at home w/ her husband and 2 sons   Right-handed   Caffeine: 4-6 cups of coffee per day      PHYSICAL EXAM Generalized: Well developed, in no acute distress   Neurological examination  Mentation: Alert oriented to time, place, history taking. Follows all commands speech and language fluent Cranial nerve II-XII:Extraocular movements were full. Facial symmetry noted. uvula tongue midline. Head turning and shoulder shrug  were normal and symmetric. Motor: Good strength throughout subjectively per patient Sensory: Sensory testing is intact to soft touch on all 4 extremities subjectively per patient Coordination: Cerebellar testing reveals good finger-nose-finger  Gait and station: Patient is able to stand from a seated position. gait is normal.  Reflexes: UTA  DIAGNOSTIC DATA (LABS, IMAGING, TESTING) - I reviewed patient records, labs, notes, testing and imaging myself where available.  Lab Results  Component Value Date   WBC 8.3 02/09/2010   HGB 9.7 (L) 02/09/2010   HCT 28.4 (L) 02/09/2010   MCV 94.3 02/09/2010   PLT 179 02/09/2010       ASSESSMENT AND PLAN 43 y.o. year old female  has a past medical history of ADHD, Common migraine with intractable migraine (03/16/2016), High cholesterol, Kidney stones (2018), and Migraine. here with:  1.  Migraine headache  The patient will continue with Botox injections.  Her next injection is August 3.  I will refill diclofenac.  Her Maxalt was recently refilled.  She is advised that if her headaches worsen she should let us know.  She will follow-up on 8/3 for injections.  I spent 15 minutes with the patient this time was spent reviewing her chart prior to the visit, discussing medication  and plan of care.   Butch PennyMegan Myna Freimark, MSN, NP-C 10/10/2018, 1:22 PM [facility] [address], [address] [place], [place] [id number] [phone]\

## 2018-10-22 ENCOUNTER — Other Ambulatory Visit: Payer: Self-pay | Admitting: Neurology

## 2018-10-22 ENCOUNTER — Telehealth: Payer: Self-pay | Admitting: Neurology

## 2018-10-22 NOTE — Telephone Encounter (Signed)
Fanny from [facility] called needing to know the frequency of the BOTOX injections for the pt. Please advise.

## 2018-10-22 NOTE — Telephone Encounter (Signed)
I reached out to the pharmacist, Roe Coombs and confirmed Ms. Bisson receives her botox every 3 months.

## 2018-11-27 ENCOUNTER — Other Ambulatory Visit: Payer: Self-pay | Admitting: Neurology

## 2018-12-24 ENCOUNTER — Ambulatory Visit (INDEPENDENT_AMBULATORY_CARE_PROVIDER_SITE_OTHER): Payer: 59 | Admitting: Neurology

## 2018-12-24 ENCOUNTER — Encounter: Payer: Self-pay | Admitting: Neurology

## 2018-12-24 ENCOUNTER — Other Ambulatory Visit: Payer: Self-pay

## 2018-12-24 VITALS — BP 133/91 | HR 88 | Wt 134.0 lb

## 2018-12-24 DIAGNOSIS — G43019 Migraine without aura, intractable, without status migrainosus: Secondary | ICD-10-CM

## 2018-12-24 NOTE — Procedures (Signed)
     BOTOX PROCEDURE NOTE FOR MIGRAINE HEADACHE   HISTORY: Sharon Osborn is a 43 year old patient with a history of intractable migraine headache.  She has gotten excellent benefit with the Botox injections over time, the medication significantly reduces her headache frequency and severity, she is functioning much better.  She comes in for another Botox treatment.   Description of procedure:  The patient was placed in a sitting position. The standard protocol was used for Botox as follows, with 5 units of Botox injected at each site:   -Procerus muscle, midline injection  -Corrugator muscle, bilateral injection  -Frontalis muscle, bilateral injection, with 2 sites each side, medial injection was performed in the upper one third of the frontalis muscle, in the region vertical from the medial inferior edge of the superior orbital rim. The lateral injection was again in the upper one third of the forehead vertically above the lateral limbus of the cornea, 1.5 cm lateral to the medial injection site.  -Temporalis muscle injection, 4 sites, bilaterally. The first injection was 3 cm above the tragus of the ear, second injection site was 1.5 cm to 3 cm up from the first injection site in line with the tragus of the ear. The third injection site was 1.5-3 cm forward between the first 2 injection sites. The fourth injection site was 1.5 cm posterior to the second injection site.  -Occipitalis muscle injection, 3 sites, bilaterally. The first injection was done one half way between the occipital protuberance and the tip of the mastoid process behind the ear. The second injection site was done lateral and superior to the first, 1 fingerbreadth from the first injection. The third injection site was 1 fingerbreadth superiorly and medially from the first injection site.  -Cervical paraspinal muscle injection, 2 sites, bilateral, the first injection site was 1 cm from the midline of the cervical spine, 3 cm  inferior to the lower border of the occipital protuberance. The second injection site was 1.5 cm superiorly and laterally to the first injection site.  -Trapezius muscle injection was performed at 3 sites, bilaterally. The first injection site was in the upper trapezius muscle halfway between the inflection point of the neck, and the acromion. The second injection site was one half way between the acromion and the first injection site. The third injection was done between the first injection site and the inflection point of the neck.   A 200 unit bottle of Botox was used, 155 units were injected, the rest of the Botox was wasted. The patient tolerated the procedure well, there were no complications of the above procedure.  Botox NDC 6948-5462-70 Lot number [id number] Expiration date February 2023

## 2019-01-16 ENCOUNTER — Other Ambulatory Visit: Payer: Self-pay | Admitting: Neurology

## 2019-01-30 ENCOUNTER — Other Ambulatory Visit: Payer: Self-pay | Admitting: Adult Health

## 2019-02-12 ENCOUNTER — Other Ambulatory Visit: Payer: Self-pay | Admitting: Neurology

## 2019-03-18 ENCOUNTER — Other Ambulatory Visit: Payer: Self-pay | Admitting: Adult Health

## 2019-04-01 ENCOUNTER — Encounter: Payer: Self-pay | Admitting: Neurology

## 2019-04-01 ENCOUNTER — Ambulatory Visit (INDEPENDENT_AMBULATORY_CARE_PROVIDER_SITE_OTHER): Payer: 59 | Admitting: Neurology

## 2019-04-01 ENCOUNTER — Other Ambulatory Visit: Payer: Self-pay

## 2019-04-01 VITALS — BP 129/81 | HR 85 | Temp 97.9°F | Wt 146.0 lb

## 2019-04-01 DIAGNOSIS — G43019 Migraine without aura, intractable, without status migrainosus: Secondary | ICD-10-CM | POA: Diagnosis not present

## 2019-04-01 NOTE — Procedures (Signed)
     BOTOX PROCEDURE NOTE FOR MIGRAINE HEADACHE   HISTORY: Sharon Osborn is a 43 year old patient with a history of intractable migraine headaches.  The patient is getting benefit with the Botox, she generally will have a 2-week timeframe where the medication will wear off prior to her next injection and the headaches will significantly worsen.  Otherwise, she gets good benefit with the treatments.   Description of procedure:  The patient was placed in a sitting position. The standard protocol was used for Botox as follows, with 5 units of Botox injected at each site:   -Procerus muscle, midline injection  -Corrugator muscle, bilateral injection  -Frontalis muscle, bilateral injection, with 2 sites each side, medial injection was performed in the upper one third of the frontalis muscle, in the region vertical from the medial inferior edge of the superior orbital rim. The lateral injection was again in the upper one third of the forehead vertically above the lateral limbus of the cornea, 1.5 cm lateral to the medial injection site.  -Temporalis muscle injection, 4 sites, bilaterally. The first injection was 3 cm above the tragus of the ear, second injection site was 1.5 cm to 3 cm up from the first injection site in line with the tragus of the ear. The third injection site was 1.5-3 cm forward between the first 2 injection sites. The fourth injection site was 1.5 cm posterior to the second injection site.  -Occipitalis muscle injection, 3 sites, bilaterally. The first injection was done one half way between the occipital protuberance and the tip of the mastoid process behind the ear. The second injection site was done lateral and superior to the first, 1 fingerbreadth from the first injection. The third injection site was 1 fingerbreadth superiorly and medially from the first injection site.  -Cervical paraspinal muscle injection, 2 sites, bilateral, the first injection site was 1 cm from the  midline of the cervical spine, 3 cm inferior to the lower border of the occipital protuberance. The second injection site was 1.5 cm superiorly and laterally to the first injection site.  -Trapezius muscle injection was performed at 3 sites, bilaterally. The first injection site was in the upper trapezius muscle halfway between the inflection point of the neck, and the acromion. The second injection site was one half way between the acromion and the first injection site. The third injection was done between the first injection site and the inflection point of the neck.   A 200 unit bottle of Botox was used, 155 units were injected, the rest of the Botox was wasted. The patient tolerated the procedure well, there were no complications of the above procedure.  Botox NDC 0623-7628-31 Lot number [id number] Expiration date August 2023

## 2019-04-01 NOTE — Progress Notes (Signed)
Please refer to Botox procedure note. 

## 2019-04-10 ENCOUNTER — Other Ambulatory Visit: Payer: Self-pay | Admitting: Neurology

## 2019-05-01 ENCOUNTER — Telehealth: Payer: Self-pay

## 2019-05-01 NOTE — Telephone Encounter (Signed)
Please call and make her aware of apt date and time. DW

## 2019-05-01 NOTE — Telephone Encounter (Signed)
Pt was called and informed and was also transferred to billing to pay her bill.

## 2019-05-05 ENCOUNTER — Other Ambulatory Visit: Payer: Self-pay | Admitting: Adult Health

## 2019-05-21 ENCOUNTER — Other Ambulatory Visit: Payer: Self-pay | Admitting: Neurology

## 2019-05-22 ENCOUNTER — Other Ambulatory Visit: Payer: Self-pay | Admitting: Neurology

## 2019-06-27 ENCOUNTER — Other Ambulatory Visit: Payer: Self-pay | Admitting: Adult Health

## 2019-07-10 ENCOUNTER — Ambulatory Visit: Payer: 59 | Admitting: Neurology

## 2019-07-11 ENCOUNTER — Ambulatory Visit: Payer: 59 | Admitting: Neurology

## 2019-07-18 ENCOUNTER — Other Ambulatory Visit: Payer: Self-pay

## 2019-07-18 ENCOUNTER — Ambulatory Visit (INDEPENDENT_AMBULATORY_CARE_PROVIDER_SITE_OTHER): Payer: 59 | Admitting: Adult Health

## 2019-07-18 ENCOUNTER — Encounter: Payer: Self-pay | Admitting: Adult Health

## 2019-07-18 VITALS — Temp 97.3°F | Ht 70.0 in

## 2019-07-18 DIAGNOSIS — G43009 Migraine without aura, not intractable, without status migrainosus: Secondary | ICD-10-CM | POA: Diagnosis not present

## 2019-07-18 MED ORDER — UBRELVY 100 MG PO TABS: 100.0000 mg | ORAL_TABLET | Freq: Every day | ORAL | 5 refills | Status: DC | PRN

## 2019-07-18 NOTE — Progress Notes (Signed)
       BOTOX PROCEDURE NOTE FOR MIGRAINE HEADACHE    Contraindications and precautions discussed with patient(above). Aseptic procedure was observed and patient tolerated procedure. Procedure performed by Butch Penny, NP  The condition has existed for more than 6 months, and pt does not have a diagnosis of ALS, Myasthenia Gravis or [name]-Eaton Syndrome.  Risks and benefits of injections discussed and pt agrees to proceed with the procedure.  Written consent obtained  These injections are medically necessary. She receives good benefits from these injections. Headaches have decreased in severity and frequency. These injections do not cause sedations or hallucinations which the oral therapies may cause.  Indication/Diagnosis: chronic migraine BOTOX(J0585) injection was performed according to protocol by [employer]. 200 units of BOTOX was dissolved into 4 cc NS.   NDC: [address]-32  Type of toxin: Botox Botox- 100 units x 2 vials Lot: [id number] Expiration: 03/2022 NDC: 2536-6440-34  Bacteriostatic 0.9% Sodium Chloride- 55mL total Lot: [id number] Expiration: 08/22/2019 NDC: 6387-5643-32  Dx: R51.884 S/P    Description of procedure:  The patient was placed in a sitting position. The standard protocol was used for Botox as follows, with 5 units of Botox injected at each site:   -Procerus muscle, midline injection  -Corrugator muscle, bilateral injection  -Frontalis muscle, bilateral injection, with 2 sites each side, medial injection was performed in the upper one third of the frontalis muscle, in the region vertical from the medial inferior edge of the superior orbital rim. The lateral injection was again in the upper one third of the forehead vertically above the lateral limbus of the cornea, 1.5 cm lateral to the medial injection site.  -Temporalis muscle injection, 4 sites, bilaterally. The first injection was 3 cm above the tragus of the ear, second injection site was 1.5 cm  to 3 cm up from the first injection site in line with the tragus of the ear. The third injection site was 1.5-3 cm forward between the first 2 injection sites. The fourth injection site was 1.5 cm posterior to the second injection site.  -Occipitalis muscle injection, 3 sites, bilaterally. The first injection was done one half way between the occipital protuberance and the tip of the mastoid process behind the ear. The second injection site was done lateral and superior to the first, 1 fingerbreadth from the first injection. The third injection site was 1 fingerbreadth superiorly and medially from the first injection site.  -Cervical paraspinal muscle injection, 2 sites, bilateral knee first injection site was 1 cm from the midline of the cervical spine, 3 cm inferior to the lower border of the occipital protuberance. The second injection site was 1.5 cm superiorly and laterally to the first injection site.  -Trapezius muscle injection was performed at 3 sites, bilaterally. The first injection site was in the upper trapezius muscle halfway between the inflection point of the neck, and the acromion. The second injection site was one half way between the acromion and the first injection site. The third injection was done between the first injection site and the inflection point of the neck.   Will return for repeat injection in 3 months.   A 200 unit sof Botox was used, 155 units were injected, the rest of the Botox was wasted. The patient tolerated the procedure well, there were no complications of the above procedure.  Butch Penny, MSN, NP-C 07/18/2019, 10:36 AM [facility] [address], [address] [place], [place] [address] [phone]

## 2019-07-18 NOTE — Progress Notes (Signed)
Patient is interested in trying  [place].  I reviewed his medication with the patient an order was sent.

## 2019-07-18 NOTE — Progress Notes (Signed)
Botox- 100 units x 2 vials Lot: [id number] Expiration: 03/2022 NDC: 1657-9038-33  Bacteriostatic 0.9% Sodium Chloride- 79mL total Lot: [id number] Expiration: 08/22/2019 NDC: 9166-0600-45  Dx: G43.019 S/P

## 2019-07-23 ENCOUNTER — Telehealth: Payer: Self-pay

## 2019-07-23 NOTE — Telephone Encounter (Signed)
PA for Bernita Raisin has been completed and sent to cmm. (Key: [id number])

## 2019-07-23 NOTE — Telephone Encounter (Signed)
PA for Bernita Raisin has been approved # 8 tabs for a 30 day supply through [facility]. [id number]

## 2019-08-01 ENCOUNTER — Telehealth: Payer: Self-pay | Admitting: Adult Health

## 2019-08-01 NOTE — Telephone Encounter (Signed)
I called pt to discuss. No answer, left a message asking her to call me back. 

## 2019-08-01 NOTE — Telephone Encounter (Signed)
Pt called requesting a CB from RN pt states she is still having migraines and is not feeling any relief and would like to know what she can do

## 2019-08-02 ENCOUNTER — Other Ambulatory Visit: Payer: Self-pay | Admitting: Adult Health

## 2019-08-02 NOTE — Telephone Encounter (Signed)
Phone rep checked office voicemail's,@ 9:33 this morning pt left a message stating she is unable to get relief from her migraines, she is asking to be called.

## 2019-08-05 NOTE — Telephone Encounter (Signed)
I called and spoke to pt. She has had migraine headache daily since prior to her botox,.  One day w/o headache.  Depacon helped, but caused SE depression, she does not want, prednisone helped.  Her volaren/ maxalt has not helped.  She flet like ubrelvy (she did not give it a real try).  Please address today if can HT [place] , [place].

## 2019-08-06 MED ORDER — PREDNISONE 5 MG PO TABS: ORAL_TABLET | ORAL | 0 refills | Status: DC

## 2019-08-06 NOTE — Telephone Encounter (Signed)
I called pt and discussed this with her. She will try the prednisone dosepak and let us know if it is not beneficial to her migraine.

## 2019-08-06 NOTE — Addendum Note (Signed)
Addended by: Enedina Finner on: 08/06/2019 09:37 AM   Modules accepted: Orders

## 2019-08-06 NOTE — Telephone Encounter (Signed)
Please advise patient that I sent in a prednisone Dosepak.  If this is not helpful she should let us know.  Reviewing her chart it appears that she has had prednisone before.

## 2019-08-21 ENCOUNTER — Telehealth: Payer: Self-pay | Admitting: Neurology

## 2019-08-21 NOTE — Telephone Encounter (Signed)
Pt called stating that she has been waiting for a call from the BOTOX coordinator. Please advise.

## 2019-09-07 ENCOUNTER — Other Ambulatory Visit: Payer: Self-pay | Admitting: Adult Health

## 2019-09-26 ENCOUNTER — Other Ambulatory Visit: Payer: Self-pay | Admitting: Neurology

## 2019-09-30 ENCOUNTER — Telehealth: Payer: Self-pay | Admitting: *Deleted

## 2019-09-30 NOTE — Telephone Encounter (Signed)
Patient is scheduled for Botox on 10/16/2019.  I called [employer] [phone] and spoke to Shaune Pollack  She states that [phone] and [phone] are valid and billable.  It was suggested to get a  Predetermination for [phone].  Call [facility] [phone] to obtain PreD. Ref# for this call [phone]. I called [facility] to initiate the PreD.  I spoke to [facility].  She entered all information and the PreD was approved.  Temp# [id number] Valid from 09/30/2019-09/29/20. Ref# for call is [phone] PM.  I called [facility] [phone] and spoke to [place].  He states this must go through Medical Benefits.  Once this is complete and patient consent is obtained they will call to schedule delivery. Caryn Bee put a note in that we would like this completed by 10/09/2019

## 2019-10-01 NOTE — Telephone Encounter (Signed)
Caryn Bee with [facility] called and scheduled Botox delivery for 10/09/2019.

## 2019-10-02 NOTE — Telephone Encounter (Signed)
I got a fax from [facility] requesting a call back to update the diagnosis on the prescription.  Auth# [id number] has a diagnosis code of G43.009 and that is different from the prescription diagnosis.  I called and confirmed from the last Botox procedure that the diagnosis is G43.009.  I spoke to [place] [facility] and the pharmacist Ronaldo Miyamoto to update. Delivery is still scheduled for 10/09/2019

## 2019-10-09 NOTE — Telephone Encounter (Signed)
Medication delivered 10/09/19 for 10/16/19 appointment

## 2019-10-16 ENCOUNTER — Telehealth: Payer: Self-pay

## 2019-10-16 ENCOUNTER — Ambulatory Visit (INDEPENDENT_AMBULATORY_CARE_PROVIDER_SITE_OTHER): Payer: 59 | Admitting: Neurology

## 2019-10-16 ENCOUNTER — Other Ambulatory Visit: Payer: Self-pay

## 2019-10-16 ENCOUNTER — Encounter: Payer: Self-pay | Admitting: Neurology

## 2019-10-16 VITALS — BP 126/76 | HR 68

## 2019-10-16 DIAGNOSIS — G43019 Migraine without aura, intractable, without status migrainosus: Secondary | ICD-10-CM

## 2019-10-16 NOTE — Procedures (Signed)
     BOTOX PROCEDURE NOTE FOR MIGRAINE HEADACHE   HISTORY: Sharon Osborn is a 44 year old patient with a history of intractable migraine headache.  Following her last Botox injection in February 2021, she had severe headaches for 1 month requiring several courses of prednisone.  The Botox seem to activate her headache, but then she has done very well with her headache frequency.  She comes in for another injection.   Description of procedure:  The patient was placed in a sitting position. The standard protocol was used for Botox as follows, with 5 units of Botox injected at each site:   -Procerus muscle, midline injection  -Corrugator muscle, bilateral injection  -Frontalis muscle, bilateral injection, with 2 sites each side, medial injection was performed in the upper one third of the frontalis muscle, in the region vertical from the medial inferior edge of the superior orbital rim. The lateral injection was again in the upper one third of the forehead vertically above the lateral limbus of the cornea, 1.5 cm lateral to the medial injection site.  -Temporalis muscle injection, 4 sites, bilaterally. The first injection was 3 cm above the tragus of the ear, second injection site was 1.5 cm to 3 cm up from the first injection site in line with the tragus of the ear. The third injection site was 1.5-3 cm forward between the first 2 injection sites. The fourth injection site was 1.5 cm posterior to the second injection site.  -Occipitalis muscle injection, 3 sites, bilaterally. The first injection was done one half way between the occipital protuberance and the tip of the mastoid process behind the ear. The second injection site was done lateral and superior to the first, 1 fingerbreadth from the first injection. The third injection site was 1 fingerbreadth superiorly and medially from the first injection site.  -Cervical paraspinal muscle injection, 2 sites, bilateral, the first injection site was  1 cm from the midline of the cervical spine, 3 cm inferior to the lower border of the occipital protuberance. The second injection site was 1.5 cm superiorly and laterally to the first injection site.  -Trapezius muscle injection was performed at 3 sites, bilaterally. The first injection site was in the upper trapezius muscle halfway between the inflection point of the neck, and the acromion. The second injection site was one half way between the acromion and the first injection site. The third injection was done between the first injection site and the inflection point of the neck.   A 200 unit bottle of Botox was used, 155 units were injected, the rest of the Botox was wasted. The patient tolerated the procedure well, there were no complications of the above procedure.  Botox NDC 4034-7425-95 Lot number [id number] Expiration date October 2023

## 2019-10-16 NOTE — Telephone Encounter (Signed)
I called patient and rescheduled her appointment to 9/1 at 11am.

## 2019-10-16 NOTE — Telephone Encounter (Signed)
I receive an appt card for pt 3 month botox. I called pt to give her the appt for 01/20/2020 at 0830. Pt stated that she lives and hour away and cannot make that time. I stated the botox coordinator does the reschedule when they are at their appt.i stated message will be sent to [facility] about her appt. Pt verbalized understanding.

## 2019-10-16 NOTE — Progress Notes (Signed)
Please refer to Botox procedure note. 

## 2019-10-24 ENCOUNTER — Telehealth: Payer: Self-pay | Admitting: Neurology

## 2019-10-24 NOTE — Telephone Encounter (Signed)
Patient has Botox appointment on 01/22/20.

## 2019-10-24 NOTE — Telephone Encounter (Signed)
Per Dr. Anne Hahn this Patient will need a Botox apt in 90 days.

## 2019-10-29 ENCOUNTER — Other Ambulatory Visit: Payer: Self-pay | Admitting: Adult Health

## 2019-12-02 ENCOUNTER — Other Ambulatory Visit: Payer: Self-pay | Admitting: Neurology

## 2019-12-24 ENCOUNTER — Other Ambulatory Visit: Payer: Self-pay | Admitting: Adult Health

## 2020-01-09 ENCOUNTER — Telehealth: Payer: Self-pay | Admitting: Neurology

## 2020-01-09 NOTE — Telephone Encounter (Signed)
Patient has next Botox injection on 9/1. I called [facility] and spoke with Roland to see about scheduling Botox delivery. He set up a tentative delivery of 8/24. He states that the order needs to go through medical benefit review.

## 2020-01-16 NOTE — Telephone Encounter (Signed)
I called [facility] [phone]) and spoke with Ram to check status of Botox delivery. He states that medical benefit review has still not been completed. He advised me to call back in 24 hours.

## 2020-01-20 ENCOUNTER — Ambulatory Visit: Payer: 59 | Admitting: Neurology

## 2020-01-20 NOTE — Telephone Encounter (Signed)
I called [facility] and spoke with Velna Hatchet to schedule delivery. Botox delivery scheduled for 8/31.

## 2020-01-21 NOTE — Telephone Encounter (Signed)
(  2) 100U vials of Botox delivered today from [facility] for patient's 9/1 appointment.

## 2020-01-22 ENCOUNTER — Other Ambulatory Visit: Payer: Self-pay

## 2020-01-22 ENCOUNTER — Ambulatory Visit: Payer: 59 | Admitting: Neurology

## 2020-01-22 ENCOUNTER — Encounter: Payer: Self-pay | Admitting: Neurology

## 2020-01-22 VITALS — BP 138/85 | HR 62

## 2020-01-22 DIAGNOSIS — G43019 Migraine without aura, intractable, without status migrainosus: Secondary | ICD-10-CM | POA: Diagnosis not present

## 2020-01-22 NOTE — Procedures (Signed)
     BOTOX PROCEDURE NOTE FOR MIGRAINE HEADACHE   HISTORY: Sharon Osborn is a 45 year old patient with a history of intractable migraine headache.  She does well with the Botox but usually has increased headache for 2 to 3 weeks prior to the next injection.  She did have a left temporal sharp pain several weeks ago lasting 10 minutes that may have represented an ice pick pain.  She has noted that the use of Bernita Raisin has been helpful in preventing headaches when she uses the medication when she knows she is going to get a headache.   Description of procedure:  The patient was placed in a sitting position. The standard protocol was used for Botox as follows, with 5 units of Botox injected at each site:   -Procerus muscle, midline injection  -Corrugator muscle, bilateral injection  -Frontalis muscle, bilateral injection, with 2 sites each side, medial injection was performed in the upper one third of the frontalis muscle, in the region vertical from the medial inferior edge of the superior orbital rim. The lateral injection was again in the upper one third of the forehead vertically above the lateral limbus of the cornea, 1.5 cm lateral to the medial injection site.  -Temporalis muscle injection, 4 sites, bilaterally. The first injection was 3 cm above the tragus of the ear, second injection site was 1.5 cm to 3 cm up from the first injection site in line with the tragus of the ear. The third injection site was 1.5-3 cm forward between the first 2 injection sites. The fourth injection site was 1.5 cm posterior to the second injection site.  -Occipitalis muscle injection, 3 sites, bilaterally. The first injection was done one half way between the occipital protuberance and the tip of the mastoid process behind the ear. The second injection site was done lateral and superior to the first, 1 fingerbreadth from the first injection. The third injection site was 1 fingerbreadth superiorly and medially from  the first injection site.  -Cervical paraspinal muscle injection, 2 sites, bilateral, the first injection site was 1 cm from the midline of the cervical spine, 3 cm inferior to the lower border of the occipital protuberance. The second injection site was 1.5 cm superiorly and laterally to the first injection site.  -Trapezius muscle injection was performed at 3 sites, bilaterally. The first injection site was in the upper trapezius muscle halfway between the inflection point of the neck, and the acromion. The second injection site was one half way between the acromion and the first injection site. The third injection was done between the first injection site and the inflection point of the neck.   A 200 unit bottle of Botox was used, 155 units were injected, the rest of the Botox was wasted. The patient tolerated the procedure well, there were no complications of the above procedure.  Botox NDC 3976-7341-93 Lot number [id number] Expiration date November 2023

## 2020-01-22 NOTE — Progress Notes (Signed)
Botox- 100 units x 2 vials Lot: [id number] Expiration: 03/2022 NDC: 4259-5638-75  Bacteriostatic 0.9% Sodium Chloride- 95mL total Lot: [id number] Expiration: 02/20/2021 NDC: 5188-4166-06  S/P

## 2020-03-10 ENCOUNTER — Other Ambulatory Visit: Payer: Self-pay | Admitting: Adult Health

## 2020-04-22 ENCOUNTER — Telehealth: Payer: Self-pay | Admitting: Neurology

## 2020-04-22 NOTE — Telephone Encounter (Signed)
Patient has a Botox appointment on 12/6. I called [facility] and spoke with Sol to check the status of delivery. Sol states that the order is under medical benefit review. She states [facility] will call me when Botox is ready to be delivered.

## 2020-04-23 NOTE — Telephone Encounter (Signed)
I called [facility] and spoke with Shanda Bumps to see if Botox can be delivered. Botox TBD 12/6.

## 2020-04-27 ENCOUNTER — Ambulatory Visit (INDEPENDENT_AMBULATORY_CARE_PROVIDER_SITE_OTHER): Payer: 59 | Admitting: Neurology

## 2020-04-27 ENCOUNTER — Encounter: Payer: Self-pay | Admitting: Neurology

## 2020-04-27 VITALS — BP 131/79 | HR 75 | Wt 141.0 lb

## 2020-04-27 DIAGNOSIS — G43019 Migraine without aura, intractable, without status migrainosus: Secondary | ICD-10-CM

## 2020-04-27 NOTE — Procedures (Signed)
     BOTOX PROCEDURE NOTE FOR MIGRAINE HEADACHE   HISTORY: Sharon Osborn is a 44 year old patient with a history Ampyra for migraine headaches.  She has gained excellent benefit with the use of Botox injections, and she comes in for another therapy session today.  She recently had the Covid infection, she has been mainly recovered from this but still has some residual symptoms.   Description of procedure:  The patient was placed in a sitting position. The standard protocol was used for Botox as follows, with 5 units of Botox injected at each site:   -Procerus muscle, midline injection  -Corrugator muscle, bilateral injection  -Frontalis muscle, bilateral injection, with 2 sites each side, medial injection was performed in the upper one third of the frontalis muscle, in the region vertical from the medial inferior edge of the superior orbital rim. The lateral injection was again in the upper one third of the forehead vertically above the lateral limbus of the cornea, 1.5 cm lateral to the medial injection site.  -Temporalis muscle injection, 4 sites, bilaterally. The first injection was 3 cm above the tragus of the ear, second injection site was 1.5 cm to 3 cm up from the first injection site in line with the tragus of the ear. The third injection site was 1.5-3 cm forward between the first 2 injection sites. The fourth injection site was 1.5 cm posterior to the second injection site.  -Occipitalis muscle injection, 3 sites, bilaterally. The first injection was done one half way between the occipital protuberance and the tip of the mastoid process behind the ear. The second injection site was done lateral and superior to the first, 1 fingerbreadth from the first injection. The third injection site was 1 fingerbreadth superiorly and medially from the first injection site.  -Cervical paraspinal muscle injection, 2 sites, bilateral, the first injection site was 1 cm from the midline of the cervical  spine, 3 cm inferior to the lower border of the occipital protuberance. The second injection site was 1.5 cm superiorly and laterally to the first injection site.  -Trapezius muscle injection was performed at 3 sites, bilaterally. The first injection site was in the upper trapezius muscle halfway between the inflection point of the neck, and the acromion. The second injection site was one half way between the acromion and the first injection site. The third injection was done between the first injection site and the inflection point of the neck.   A 200 unit bottle of Botox was used, 155 units were injected, the rest of the Botox was wasted. The patient tolerated the procedure well, there were no complications of the above procedure.  Botox NDC 2542-7062-37 Lot number [id number] Expiration date February 2024

## 2020-04-27 NOTE — Progress Notes (Signed)
Please refer to Botox procedure note. 

## 2020-04-27 NOTE — Telephone Encounter (Signed)
(  2) 100U vials of Botox delivered today from [facility]. 

## 2020-05-12 ENCOUNTER — Other Ambulatory Visit: Payer: Self-pay | Admitting: Adult Health

## 2020-05-12 ENCOUNTER — Other Ambulatory Visit: Payer: Self-pay | Admitting: Neurology

## 2020-06-18 ENCOUNTER — Other Ambulatory Visit: Payer: Self-pay | Admitting: Adult Health

## 2020-06-25 ENCOUNTER — Other Ambulatory Visit: Payer: Self-pay | Admitting: Neurology

## 2020-06-30 ENCOUNTER — Telehealth: Payer: Self-pay | Admitting: Neurology

## 2020-06-30 ENCOUNTER — Other Ambulatory Visit: Payer: Self-pay | Admitting: Emergency Medicine

## 2020-06-30 MED ORDER — UBRELVY 100 MG PO TABS: 1.0000 | ORAL_TABLET | ORAL | 1 refills | Status: DC | PRN

## 2020-06-30 NOTE — Telephone Encounter (Signed)
Called patient back to schedule an appointment. Will call her back with NP schedule.

## 2020-06-30 NOTE — Telephone Encounter (Signed)
Pt called, spoke with the pharmacy about my refill for UBRELVY 100 MG TABS. Pharmacy said, there a note "followup required with office.  I Pattricia Boss) informed Pt she need to schedule a follow up appt before your medication can be refilled. Pt stated, I have been coming to get my Botox and seeing Dr. Anne Hahn. I do not understand.  Pt would like a call from the nurse.

## 2020-06-30 NOTE — Telephone Encounter (Signed)
Pt request refill UBRELVY 100 MG TABS at [id number]

## 2020-07-01 ENCOUNTER — Telehealth (INDEPENDENT_AMBULATORY_CARE_PROVIDER_SITE_OTHER): Payer: 59 | Admitting: Adult Health

## 2020-07-01 DIAGNOSIS — G43009 Migraine without aura, not intractable, without status migrainosus: Secondary | ICD-10-CM | POA: Diagnosis not present

## 2020-07-01 MED ORDER — DICLOFENAC SODIUM 75 MG PO TBEC: DELAYED_RELEASE_TABLET | ORAL | 3 refills | Status: DC

## 2020-07-01 MED ORDER — RIZATRIPTAN BENZOATE 10 MG PO TBDP: ORAL_TABLET | ORAL | 11 refills | Status: DC

## 2020-07-01 MED ORDER — UBRELVY 100 MG PO TABS: 1.0000 | ORAL_TABLET | ORAL | 11 refills | Status: DC | PRN

## 2020-07-01 NOTE — Progress Notes (Signed)
PATIENT: Sharon Osborn DOB: [birth year]  REASON FOR VISIT: follow up HISTORY FROM: patient  Virtual Visit via Video Note  I connected with Sharon Osborn on 07/01/20 at 11:00 AM EST by a video enabled telemedicine application located remotely at [facility] and verified that I am speaking with the correct person using two identifiers who was located at their own home.   I discussed the limitations of evaluation and management by telemedicine and the availability of in person appointments. The patient expressed understanding and agreed to proceed.   PATIENT: Sharon Osborn DOB: 1975/10/24  REASON FOR VISIT: follow up HISTORY FROM: patient  HISTORY OF PRESENT ILLNESS: Today 07/01/20:  Sharon Osborn is a 45 year old female with a history of migraine headaches. She returns today for follow-up.  She reports that Botox continues to work well for her.  She uses diclofenac and Maxalt to treat her headaches.  She states that her headaches are very predictable.  Therefore she usually will take the Ubrelvy 24 hours before she knows that a migraine may be coming.  She states that this has been working well to prevent additional headaches.  She also notes that since she has had Covid she has noticed an increase in her headaches.  But she does feel that it has gotten better.  She returns today for an evaluation.  10/10/18:Sharon Osborn is a 45 year old female with a history of migraine headaches.  She returns today for a virtual visit.  The patient recently had Botox injection April 30 with Dr. Anne Hahn.  She reports that her stress has increased since COVID-19.  Reports that she is trying to do school with her kids but also working from home.  She also states that pollen levels has also affected her migraines.  She states on average she has about 8-10 migraines a month whereas before COVID-19 she was having approximately 5 to 6 months.  In the past she has used  Maxalt and diclofenac.  She  states that she no longer has any diclofenac tablets but they worked well for her in the past.  She returns today for a virtual visit  REVIEW OF SYSTEMS: Out of a complete 14 system review of symptoms, the patient complains only of the following symptoms, and all other reviewed systems are negative.  See HPI  ALLERGIES: Allergies  Allergen Reactions  . Iodinated Diagnostic Agents Hives    Rash.   . Amoxicillin Hives  . Inderal [Propranolol] Other (See Comments)    Fainting spells    HOME MEDICATIONS: Outpatient Medications Prior to Visit  Medication Sig Dispense Refill  . amphetamine-dextroamphetamine (ADDERALL) 20 MG tablet Take 20 mg by mouth 3 (three) times daily.     Marland Kitchen BOTOX 100 units SOLR injection INJECT 155 UNITS  INTRAMUSCULARLY EVERY 3  MONTHS (DISCARD UNUSED  AFTER FIRST USE) 2 each 1  . buPROPion (WELLBUTRIN SR) 150 MG 12 hr tablet Take 150 mg by mouth 2 (two) times daily.     . diclofenac (VOLTAREN) 75 MG EC tablet TAKE ONE TABLET BY MOUTH TWICE A DAY AS NEEDED FOR MIGRAINE 60 tablet 0  . rizatriptan (MAXALT-MLT) 10 MG disintegrating tablet PLACE ONE TABLET UNDER THE TONGUE AT ONSET OF HEADACHE; MAY REPEAT ONE TABLET IN 2 HOURS IF NEEDED. MAX 2 TABLETS A DAY 4 tablet 2  . Ubrogepant (UBRELVY) 100 MG TABS Take 1 tablet by mouth as needed. 8 tablet 1   No facility-administered medications prior to visit.  PAST MEDICAL HISTORY: Past Medical History:  Diagnosis Date  . ADHD   . Common migraine with intractable migraine 03/16/2016  . High cholesterol   . Kidney stones 2018  . Migraine     PAST SURGICAL HISTORY: Past Surgical History:  Procedure Laterality Date  . BREAST ENHANCEMENT SURGERY  2015    FAMILY HISTORY: Family History  Problem Relation Age of Onset  . Migraines Mother   . Heart disease Mother   . High Cholesterol Mother   . Breast cancer Mother   . Kidney Stones Mother   . Heart disease Father   . High Cholesterol Father   . Migraines Sister    . Seizures Sister   . Kidney Stones Sister   . Parkinson's disease Maternal Grandmother   . Heart disease Maternal Grandfather   . Heart disease Paternal Grandfather   . High Cholesterol Paternal Grandfather   . Migraines Sister     SOCIAL HISTORY: Social History   Socioeconomic History  . Marital status: Married    Spouse name: Not on file  . Number of children: 2  . Years of education: Grad school  . Highest education level: Not on file  Occupational History  . Occupation: [employer]  Tobacco Use  . Smoking status: Former Smoker    Quit date: 03/17/2007    Years since quitting: 13.3  . Smokeless tobacco: Never Used  Substance and Sexual Activity  . Alcohol use: No    Comment: 2010  . Drug use: No  . Sexual activity: Not on file    Comment: Married  Other Topics Concern  . Not on file  Social History Narrative   Lives at home w/ her husband and 2 sons   Right-handed   Caffeine: 4-6 cups of coffee per day   Social Determinants of Health   Financial Resource Strain: Not on file  Food Insecurity: Not on file  Transportation Needs: Not on file  Physical Activity: Not on file  Stress: Not on file  Social Connections: Not on file  Intimate Partner Violence: Not on file      PHYSICAL EXAM Generalized: Well developed, in no acute distress   Neurological examination  Mentation: Alert oriented to time, place, history taking. Follows all commands speech and language fluent Cranial nerve II-XII:Extraocular movements were full. Facial symmetry noted. uvula tongue midline. Head turning and shoulder shrug  were normal and symmetric. Motor: Good strength throughout subjectively per patient Sensory: Sensory testing is intact to soft touch on all 4 extremities subjectively per patient Coordination: Cerebellar testing reveals good finger-nose-finger  Gait and station: Patient is able to stand from a seated position. gait is normal.  Reflexes: UTA  DIAGNOSTIC  DATA (LABS, IMAGING, TESTING) - I reviewed patient records, labs, notes, testing and imaging myself where available.  Lab Results  Component Value Date   WBC 8.3 02/09/2010   HGB 9.7 (L) 02/09/2010   HCT 28.4 (L) 02/09/2010   MCV 94.3 02/09/2010   PLT 179 02/09/2010      ASSESSMENT AND PLAN 45 y.o. year old female  has a past medical history of ADHD, Common migraine with intractable migraine (03/16/2016), High cholesterol, Kidney stones (2018), and Migraine. here with:  1.  Migraine headache  --Good response to Botox --Continue diclofenac and Maxalt as an abortive therapy --Continue to use Ubrelvy as an abortive therapy. --Advised to avoid taking Ubrelvy and Maxalt together --Follow-up in 1 year or sooner if needed  I spent 25 minutes of  face-to-face and non-face-to-face time with patient.  This included previsit chart review, lab review, study review, order entry, electronic health record documentation, patient education.   Butch Penny, MSN, NP-C 07/01/2020, 11:02 AM [facility] [address], [address] [place], [place] [address] [phone]

## 2020-07-03 NOTE — Progress Notes (Signed)
I have read the note, and I agree with the clinical assessment and plan.  Charles K Willis   

## 2020-07-16 ENCOUNTER — Telehealth: Payer: 59 | Admitting: Neurology

## 2020-07-16 NOTE — Telephone Encounter (Signed)
Patient's next Botox appointment is 3/14. I received a call from John with [facility] stating that a diagnosis code is needed. I provided him with G43.019 as documented in patient's chart & office notes. He states that I will need to call [phone] to initiate PA. I called and spoke with Toni Amend to initiate PA. Toni Amend states that only one provider can be on the PA at a time. Dr. Anne Hahn will be on this PA. PA is approved. PA #[id number] (07/16/20- 07/16/21). I called [facility] back and spoke with Candise Bowens. She states that the order is ready to be scheduled but patient needs to give consent. Patient has a $1253.96 copay.

## 2020-07-20 NOTE — Telephone Encounter (Signed)
I called the patient and advised that [facility] is needing her consent so that they can ship the Botox. Patient states she will call them to take care of it.

## 2020-07-22 NOTE — Telephone Encounter (Signed)
I called [facility] and spoke with [facility] to check order status. Hollie states that the patient still has not given consent.

## 2020-07-23 NOTE — Telephone Encounter (Signed)
Received call from Fayrene Fearing at [place] to schedule Botox delivery. Botox TBD 3/8.

## 2020-07-28 NOTE — Telephone Encounter (Signed)
(  2) 100 unit vials arrived today from [facility]. Lot #[id number], Exp: 07/2022. Serial: [id number], [id number].

## 2020-08-03 ENCOUNTER — Encounter: Payer: Self-pay | Admitting: Neurology

## 2020-08-03 ENCOUNTER — Ambulatory Visit (INDEPENDENT_AMBULATORY_CARE_PROVIDER_SITE_OTHER): Payer: 59 | Admitting: Neurology

## 2020-08-03 VITALS — BP 140/72 | HR 82 | Ht 70.0 in | Wt 140.0 lb

## 2020-08-03 DIAGNOSIS — G43019 Migraine without aura, intractable, without status migrainosus: Secondary | ICD-10-CM

## 2020-08-03 NOTE — Procedures (Signed)
     BOTOX PROCEDURE NOTE FOR MIGRAINE HEADACHE   HISTORY: Sharon Osborn is a 45 year old patient with a history of intractable migraine headaches who has responded quite well to Botox.  About 2 weeks prior to the Botox injection the headaches will come back.  She does note some sensitivity to pollen in the air.   Description of procedure:  The patient was placed in a sitting position. The standard protocol was used for Botox as follows, with 5 units of Botox injected at each site:   -Procerus muscle, midline injection  -Corrugator muscle, bilateral injection  -Frontalis muscle, bilateral injection, with 2 sites each side, medial injection was performed in the upper one third of the frontalis muscle, in the region vertical from the medial inferior edge of the superior orbital rim. The lateral injection was again in the upper one third of the forehead vertically above the lateral limbus of the cornea, 1.5 cm lateral to the medial injection site.  -Temporalis muscle injection, 4 sites, bilaterally. The first injection was 3 cm above the tragus of the ear, second injection site was 1.5 cm to 3 cm up from the first injection site in line with the tragus of the ear. The third injection site was 1.5-3 cm forward between the first 2 injection sites. The fourth injection site was 1.5 cm posterior to the second injection site.  -Occipitalis muscle injection, 3 sites, bilaterally. The first injection was done one half way between the occipital protuberance and the tip of the mastoid process behind the ear. The second injection site was done lateral and superior to the first, 1 fingerbreadth from the first injection. The third injection site was 1 fingerbreadth superiorly and medially from the first injection site.  -Cervical paraspinal muscle injection, 2 sites, bilateral, the first injection site was 1 cm from the midline of the cervical spine, 3 cm inferior to the lower border of the occipital  protuberance. The second injection site was 1.5 cm superiorly and laterally to the first injection site.  -Trapezius muscle injection was performed at 3 sites, bilaterally. The first injection site was in the upper trapezius muscle halfway between the inflection point of the neck, and the acromion. The second injection site was one half way between the acromion and the first injection site. The third injection was done between the first injection site and the inflection point of the neck.   A 200 unit bottle of Botox was used, 155 units were injected, the rest of the Botox was wasted. The patient tolerated the procedure well, there were no complications of the above procedure.  Botox NDC 8938-1017-51 Lot number [id number] Expiration date March 2024

## 2020-08-03 NOTE — Progress Notes (Signed)
Botox- 100 units x 2 vials Lot: [id number] Expiration: 07/2022 NDC: 2876-8115-72  Bacteriostatic 0.9% Sodium Chloride- 12mL total Lot: 02/23  Expiration: 06/2021 NDC: 6203-5597-41  Dx: G43.009  S/P

## 2020-08-04 ENCOUNTER — Telehealth: Payer: Self-pay | Admitting: *Deleted

## 2020-08-04 NOTE — Telephone Encounter (Signed)
Per Cover My Meds, [facility] has approved Ubrelvy.   "Request Reference Number: [id number]. UBRELVY TAB 100MG  is approved through 08/04/2021. Your patient may now fill this prescription and it will be covered."

## 2020-08-04 NOTE — Telephone Encounter (Signed)
Completed Bernita Raisin PA on Cover My Meds. Key: [id number]. Awaiting determination from [facility].

## 2020-10-13 ENCOUNTER — Telehealth: Payer: Self-pay | Admitting: Adult Health

## 2020-10-13 NOTE — Telephone Encounter (Signed)
[facility] Ladona Ridgel) called, need maximum monthly dose for  Ubrogepant (UBRELVY) 100 MG TABS. Will allow insurance to cover. Would like a call from the nurse.

## 2020-10-14 NOTE — Telephone Encounter (Signed)
I called pharmacy and Sharon Osborn ran thru #8 which is what she has been getting from 06-2020 last order.  Will call back as needed.

## 2020-11-03 ENCOUNTER — Telehealth: Payer: Self-pay | Admitting: Neurology

## 2020-11-03 NOTE — Telephone Encounter (Signed)
Patient has a Botox appointment 6/22. Received (2) 100 unit vials of Botox from [facility] 6/13.

## 2020-11-09 ENCOUNTER — Ambulatory Visit: Payer: 59 | Admitting: Neurology

## 2020-11-11 ENCOUNTER — Ambulatory Visit (INDEPENDENT_AMBULATORY_CARE_PROVIDER_SITE_OTHER): Payer: 59 | Admitting: Neurology

## 2020-11-11 ENCOUNTER — Encounter: Payer: Self-pay | Admitting: Neurology

## 2020-11-11 VITALS — BP 146/86 | HR 78 | Ht 70.0 in | Wt 138.6 lb

## 2020-11-11 DIAGNOSIS — G43019 Migraine without aura, intractable, without status migrainosus: Secondary | ICD-10-CM

## 2020-11-11 NOTE — Procedures (Signed)
     BOTOX PROCEDURE NOTE FOR MIGRAINE HEADACHE   HISTORY: Sharon Osborn is a 45 year old patient with a history of intractable migraine.  She has gained excellent benefit with the Botox injections, she comes in for another treatment.   Description of procedure:  The patient was placed in a sitting position. The standard protocol was used for Botox as follows, with 5 units of Botox injected at each site:   -Procerus muscle, midline injection  -Corrugator muscle, bilateral injection  -Frontalis muscle, bilateral injection, with 2 sites each side, medial injection was performed in the upper one third of the frontalis muscle, in the region vertical from the medial inferior edge of the superior orbital rim. The lateral injection was again in the upper one third of the forehead vertically above the lateral limbus of the cornea, 1.5 cm lateral to the medial injection site.  -Temporalis muscle injection, 4 sites, bilaterally. The first injection was 3 cm above the tragus of the ear, second injection site was 1.5 cm to 3 cm up from the first injection site in line with the tragus of the ear. The third injection site was 1.5-3 cm forward between the first 2 injection sites. The fourth injection site was 1.5 cm posterior to the second injection site.  -Occipitalis muscle injection, 3 sites, bilaterally. The first injection was done one half way between the occipital protuberance and the tip of the mastoid process behind the ear. The second injection site was done lateral and superior to the first, 1 fingerbreadth from the first injection. The third injection site was 1 fingerbreadth superiorly and medially from the first injection site.  -Cervical paraspinal muscle injection, 2 sites, bilateral, the first injection site was 1 cm from the midline of the cervical spine, 3 cm inferior to the lower border of the occipital protuberance. The second injection site was 1.5 cm superiorly and laterally to the  first injection site.  -Trapezius muscle injection was performed at 3 sites, bilaterally. The first injection site was in the upper trapezius muscle halfway between the inflection point of the neck, and the acromion. The second injection site was one half way between the acromion and the first injection site. The third injection was done between the first injection site and the inflection point of the neck.   A 200 unit bottle of Botox was used, 155 units were injected, the rest of the Botox was wasted. The patient tolerated the procedure well, there were no complications of the above procedure.  Botox NDC 5366-4403-47 Lot number [id number] Expiration date September 2023

## 2020-11-11 NOTE — Progress Notes (Signed)
Please refer to Botox procedure note. 

## 2020-12-28 ENCOUNTER — Other Ambulatory Visit: Payer: Self-pay | Admitting: Neurology

## 2021-02-09 ENCOUNTER — Encounter: Payer: Self-pay | Admitting: Neurology

## 2021-02-09 ENCOUNTER — Ambulatory Visit (INDEPENDENT_AMBULATORY_CARE_PROVIDER_SITE_OTHER): Payer: 59 | Admitting: Neurology

## 2021-02-09 VITALS — BP 120/82

## 2021-02-09 DIAGNOSIS — G43019 Migraine without aura, intractable, without status migrainosus: Secondary | ICD-10-CM | POA: Diagnosis not present

## 2021-02-09 NOTE — Progress Notes (Signed)
Botox- 200 units x 1 vial Lot: [id number] Expiration: 06/2023 NDC: 3419-6222-97  Bacteriostatic 0.9% Sodium Chloride- 32mL total [id number] Expiration: 12/23 NDC: 40814-481-85  Dx:G43.019   SP

## 2021-02-09 NOTE — Procedures (Signed)
     BOTOX PROCEDURE NOTE FOR MIGRAINE HEADACHE   HISTORY: Sharon Osborn is a 45 year old patient with a history of intractable migraine headache.  She has gotten excellent benefit from Botox therapy, she wishes to continue this treatment.   Description of procedure:  The patient was placed in a sitting position. The standard protocol was used for Botox as follows, with 5 units of Botox injected at each site:   -Procerus muscle, midline injection  -Corrugator muscle, bilateral injection  -Frontalis muscle, bilateral injection, with 2 sites each side, medial injection was performed in the upper one third of the frontalis muscle, in the region vertical from the medial inferior edge of the superior orbital rim. The lateral injection was again in the upper one third of the forehead vertically above the lateral limbus of the cornea, 1.5 cm lateral to the medial injection site.  -Temporalis muscle injection, 4 sites, bilaterally. The first injection was 3 cm above the tragus of the ear, second injection site was 1.5 cm to 3 cm up from the first injection site in line with the tragus of the ear. The third injection site was 1.5-3 cm forward between the first 2 injection sites. The fourth injection site was 1.5 cm posterior to the second injection site.  -Occipitalis muscle injection, 3 sites, bilaterally. The first injection was done one half way between the occipital protuberance and the tip of the mastoid process behind the ear. The second injection site was done lateral and superior to the first, 1 fingerbreadth from the first injection. The third injection site was 1 fingerbreadth superiorly and medially from the first injection site.  -Cervical paraspinal muscle injection, 2 sites, bilateral, the first injection site was 1 cm from the midline of the cervical spine, 3 cm inferior to the lower border of the occipital protuberance. The second injection site was 1.5 cm superiorly and laterally to the  first injection site.  -Trapezius muscle injection was performed at 3 sites, bilaterally. The first injection site was in the upper trapezius muscle halfway between the inflection point of the neck, and the acromion. The second injection site was one half way between the acromion and the first injection site. The third injection was done between the first injection site and the inflection point of the neck.   A 200 unit bottle of Botox was used, 155 units were injected, the rest of the Botox was wasted. The patient tolerated the procedure well, there were no complications of the above procedure.  Botox NDC 3532-9924-26 Lot number [id number] Expiration date February 2025

## 2021-02-16 ENCOUNTER — Ambulatory Visit: Payer: 59 | Admitting: Neurology

## 2021-03-06 ENCOUNTER — Other Ambulatory Visit: Payer: Self-pay | Admitting: Adult Health

## 2021-05-10 NOTE — Progress Notes (Signed)
[facility]  BOTULINUM TOXIN INJECTION PROCEDURE NOTE  Patient: Sharon Osborn MRN: [id number]  Indication: Chronic migraines   History: 45 year old female with a history of intractable chronic migraine. Ruel Favors and Maxalt as needed for rescue. Bernita Raisin helps if she takes it on days she know she will get a migraine. Maxalt helps break her migraine when Bernita Raisin does not work.  Last injection: 02/09/21  Technique: Informed consent was obtained and signed. 200 U onabotulinumtoxinA (Lot# [id number]; Expiration: 12/23) were reconstituted using normal saline, to a concentration of 5 units per 0.37ml. 155 units were injected using sterile technique across 31 sites as follows: corrugator 10, procerus 5 units, frontalis 20 units, temporalis 40 units, occipitalis 30 units, cervical paraspinal 20 units, trapezius 30 units. 45 units were wasted. Patient tolerated procedure without complication.  Plan: -Return for Botox in 3 months -Continue Maxalt and Ubrelvy for rescue  Ocie Doyne 05/11/21 2:46 PM

## 2021-05-11 ENCOUNTER — Ambulatory Visit: Payer: 59 | Admitting: Adult Health

## 2021-05-11 ENCOUNTER — Ambulatory Visit (INDEPENDENT_AMBULATORY_CARE_PROVIDER_SITE_OTHER): Payer: 59 | Admitting: Psychiatry

## 2021-05-11 ENCOUNTER — Other Ambulatory Visit: Payer: Self-pay

## 2021-05-11 VITALS — BP 129/70 | HR 79

## 2021-05-11 DIAGNOSIS — G43719 Chronic migraine without aura, intractable, without status migrainosus: Secondary | ICD-10-CM | POA: Diagnosis not present

## 2021-05-11 NOTE — Progress Notes (Signed)
Botox- 100 units x 2 vial Lot: [id number] Expiration: 07/2023 NDC: 8185-6314-97  Bacteriostatic 0.9% Sodium Chloride- 79mL total [id number] Expiration: 12/23 NDC: 02774-128-78  Dx: G43.019 SP

## 2021-07-06 ENCOUNTER — Telehealth (INDEPENDENT_AMBULATORY_CARE_PROVIDER_SITE_OTHER): Payer: 59 | Admitting: Adult Health

## 2021-07-06 DIAGNOSIS — G43009 Migraine without aura, not intractable, without status migrainosus: Secondary | ICD-10-CM

## 2021-07-06 NOTE — Progress Notes (Signed)
PATIENT: Sharon Osborn DOB: May 14, 1976  REASON FOR VISIT: follow up HISTORY FROM: patient  Virtual Visit via Video Note  I connected with Sharon Osborn on 07/06/21 at  2:15 PM EST by a video enabled telemedicine application located remotely at [facility] and verified that I am speaking with the correct person using two identifiers who was located at their own home.   I discussed the limitations of evaluation and management by telemedicine and the availability of in person appointments. The patient expressed understanding and agreed to proceed.   PATIENT: Sharon Osborn DOB: [birth year]  REASON FOR VISIT: follow up HISTORY FROM: patient  HISTORY OF PRESENT ILLNESS: Today 07/06/21:  Sharon Osborn is a 46 year old female with a history of migraine headaches.  She returns today for follow-up.  She had her last Botox injections in December with Dr. Arm[place].  She reports that Botox continues to work fairly well for her.  She has approximately 1 severe headache a month that does not respond to diclofenac, Maxalt or Ubrelvy.  Typically she can take Sharon Osborn and it prevents the headache from coming.  Once she fevelops a headache Maxalt and diclofenac work well for her.  07/01/20: Sharon Osborn is a 46 year old female with a history of migraine headaches. She returns today for follow-up.  She reports that Botox continues to work well for her.  She uses diclofenac and Maxalt to treat her headaches.  She states that her headaches are very predictable.  Therefore she usually will take the Ubrelvy 24 hours before she knows that a migraine may be coming.  She states that this has been working well to prevent additional headaches.  She also notes that since she has had Covid she has noticed an increase in her headaches.  But she does feel that it has gotten better.  She returns today for an evaluation.  10/10/18:Sharon Osborn is a 46 year old female with a history of migraine headaches.  She  returns today for a virtual visit.  The patient recently had Botox injection April 30 with Dr. Anne Osborn.  She reports that her stress has increased since COVID-19.  Reports that she is trying to do school with her kids but also working from home.  She also states that pollen levels has also affected her migraines.  She states on average she has about 8-10 migraines a month whereas before COVID-19 she was having approximately 5 to 6 months.  In the past she has used  Maxalt and diclofenac.  She states that she no longer has any diclofenac tablets but they worked well for her in the past.  She returns today for a virtual visit  REVIEW OF SYSTEMS: Out of a complete 14 system review of symptoms, the patient complains only of the following symptoms, and all other reviewed systems are negative.  See HPI  ALLERGIES: Allergies  Allergen Reactions   Iodinated Contrast Media Hives    Rash.    Amoxicillin Hives   Inderal [Propranolol] Other (See Comments)    Fainting spells    HOME MEDICATIONS: Outpatient Medications Prior to Visit  Medication Sig Dispense Refill   amphetamine-dextroamphetamine (ADDERALL) 20 MG tablet Take 20 mg by mouth 3 (three) times daily.      BOTOX 100 units SOLR injection INJECT 155 UNITS  INTRAMUSCULARLY EVERY 3  MONTHS 2 each 1   buPROPion (WELLBUTRIN SR) 150 MG 12 hr tablet Take 150 mg by mouth 2 (two) times daily.  diclofenac (VOLTAREN) 75 MG EC tablet TAKE ONE TABLET BY MOUTH TWICE A DAY AS NEEDED FOR MIGRAINE 60 tablet 3   rizatriptan (MAXALT-MLT) 10 MG disintegrating tablet PLACE ONE TABLET UNDER THE TONGUE AT ONSET OF HEADACHE; MAY REPEAT ONE TABLET IN 2 HOURS IF NEEDED. MAX 2 TABLETS A DAY 10 tablet 11   Ubrogepant (UBRELVY) 100 MG TABS Take 1 tablet by mouth as needed. 8 tablet 11   No facility-administered medications prior to visit.    PAST MEDICAL HISTORY: Past Medical History:  Diagnosis Date   ADHD    Common migraine with intractable migraine 03/16/2016    High cholesterol    Kidney stones 2018   Migraine     PAST SURGICAL HISTORY: Past Surgical History:  Procedure Laterality Date   BREAST ENHANCEMENT SURGERY  2015    FAMILY HISTORY: Family History  Problem Relation Age of Onset   Migraines Mother    Heart disease Mother    High Cholesterol Mother    Breast cancer Mother    Kidney Stones Mother    Heart disease Father    High Cholesterol Father    Migraines Sister    Seizures Sister    Kidney Stones Sister    Parkinson's disease Maternal Grandmother    Heart disease Maternal Grandfather    Heart disease Paternal Grandfather    High Cholesterol Paternal Grandfather    Migraines Sister     SOCIAL HISTORY: Social History   Socioeconomic History   Marital status: Married    Spouse name: Not on file   Number of children: 2   Years of education: Grad school   Highest education level: Not on file  Occupational History   Occupation: [employer]  Tobacco Use   Smoking status: Former    Types: Cigarettes    Quit date: 03/17/2007    Years since quitting: 14.3   Smokeless tobacco: Never  Substance and Sexual Activity   Alcohol use: No    Comment: 2010   Drug use: No   Sexual activity: Not on file    Comment: Married  Other Topics Concern   Not on file  Social History Narrative   Lives at home w/ her husband and 2 sons   Right-handed   Caffeine: 4-6 cups of coffee per day   Social Determinants of Health   Financial Resource Strain: Not on file  Food Insecurity: Not on file  Transportation Needs: Not on file  Physical Activity: Not on file  Stress: Not on file  Social Connections: Not on file  Intimate Partner Violence: Not on file      PHYSICAL EXAM Generalized: Well developed, in no acute distress   Neurological examination  Mentation: Alert oriented to time, place, history taking. Follows all commands speech and language fluent Cranial nerve II-XII:Extraocular movements were full. Facial  symmetry noted. Head turning and shoulder shrug  were normal and symmetric.  DIAGNOSTIC DATA (LABS, IMAGING, TESTING) - I reviewed patient records, labs, notes, testing and imaging myself where available.  Lab Results  Component Value Date   WBC 8.3 02/09/2010   HGB 9.7 (L) 02/09/2010   HCT 28.4 (L) 02/09/2010   MCV 94.3 02/09/2010   PLT 179 02/09/2010      ASSESSMENT AND PLAN 46 y.o. year old female  has a past medical history of ADHD, Common migraine with intractable migraine (03/16/2016), High cholesterol, Kidney stones (2018), and Migraine. here with:  1.  Migraine headache  --Good response to Botox --Continue  diclofenac and Maxalt as an abortive therapy --Continue to use Ubrelvy as an abortive therapy. --Advised to avoid taking Roselyn Meier and Maxalt together -- Keep regular follow-ups with Dr. Ulyess Mort, MSN, NP-C 07/06/2021, 2:20 PM [facility] [address] [place], [place] [address] [phone]

## 2021-07-07 ENCOUNTER — Telehealth: Payer: Self-pay

## 2021-07-07 NOTE — Telephone Encounter (Signed)
I have submitted a PA request for Ubrelvy 100mg  on CMM, Key - PA Case ID: Synetta Fail. Awaiting determination from [facility].

## 2021-07-12 NOTE — Telephone Encounter (Signed)
Request Reference Number: [id number]. UBRELVY TAB 100MG  is approved through 07/07/2022.

## 2021-07-19 ENCOUNTER — Other Ambulatory Visit: Payer: Self-pay

## 2021-07-19 ENCOUNTER — Other Ambulatory Visit: Payer: Self-pay | Admitting: Psychiatry

## 2021-07-19 ENCOUNTER — Telehealth: Payer: Self-pay | Admitting: Psychiatry

## 2021-07-19 MED ORDER — BOTOX 100 UNITS IJ SOLR: INTRAMUSCULAR | 1 refills | Status: DC

## 2021-07-19 NOTE — Telephone Encounter (Signed)
Initiated new PA on [facility] portal, [place] # [id number] (07/19/2021-07/19/2022).

## 2021-07-19 NOTE — Telephone Encounter (Signed)
Please send [facility] refill [facility].  °

## 2021-07-30 ENCOUNTER — Other Ambulatory Visit: Payer: Self-pay | Admitting: Adult Health

## 2021-08-03 ENCOUNTER — Telehealth: Payer: Self-pay | Admitting: *Deleted

## 2021-08-03 ENCOUNTER — Other Ambulatory Visit: Payer: Self-pay | Admitting: *Deleted

## 2021-08-03 ENCOUNTER — Ambulatory Visit: Payer: 59 | Admitting: Psychiatry

## 2021-08-03 MED ORDER — UBRELVY 100 MG PO TABS: 1.0000 | ORAL_TABLET | Freq: Every day | ORAL | 11 refills | Status: AC | PRN

## 2021-08-03 MED ORDER — RIZATRIPTAN BENZOATE 10 MG PO TBDP: ORAL_TABLET | ORAL | 11 refills | Status: DC

## 2021-08-03 NOTE — Telephone Encounter (Signed)
Received (2) 100 unit vials of Botox from [facility]. ?

## 2021-08-03 NOTE — Telephone Encounter (Signed)
Completed Rizatriptan PA on Cover My Meds. Key: [id number]. Awaiting determination from [facility].  ?

## 2021-08-03 NOTE — Telephone Encounter (Signed)
Couldn't get an answer at the pharmacy. Waited twice for them. I sent refills to the pharmacy specifying to fill as a 30 day supply only.  ?

## 2021-08-04 NOTE — Telephone Encounter (Signed)
Denied on March 14 ?Request Reference Number: [id number]. RIZATRIPTAN TAB 10MG  ODT is denied for not meeting the prior authorization requirement(s). Details of this decision are in the notice attached below or have been faxed to you. ? ?Checked the letter. Rizatriptan ODT is not a covered benefit and is therefore administratively denied. Patient can still get this using a good Rx coupon.  ?

## 2021-08-05 ENCOUNTER — Ambulatory Visit (INDEPENDENT_AMBULATORY_CARE_PROVIDER_SITE_OTHER): Payer: 59 | Admitting: Psychiatry

## 2021-08-05 ENCOUNTER — Other Ambulatory Visit: Payer: Self-pay

## 2021-08-05 VITALS — BP 154/99 | HR 98

## 2021-08-05 DIAGNOSIS — G43019 Migraine without aura, intractable, without status migrainosus: Secondary | ICD-10-CM | POA: Diagnosis not present

## 2021-08-05 NOTE — Progress Notes (Signed)
Botox- 100 units x 2 vial ?Lot: [id number] ?Expiration: 09/2023 ?NDC: [phone] ?  ?Bacteriostatic 0.9% Sodium Chloride- 69mL total ?[id number] ?Expiration: 12/22/22 ?NDC: 7096-2836-62 ?  ?Dx: H47.654 ?SP ?

## 2021-08-05 NOTE — Progress Notes (Signed)
[facility] ? ?BOTULINUM TOXIN INJECTION PROCEDURE NOTE ? ?Patient: Sharon Osborn ?MRN: [id number] ? ?Indication: Chronic migraines  ? ?History: 46 year old female with a history of intractable chronic migraine. ? ?Last injection: 05/11/21 ? ?Technique: Informed consent was obtained and signed. 200 units onabotulinumtoxinA (Lot# [id number]; Expiration: 09/2023) were reconstituted using normal saline, to a concentration of 5 units per 0.99ml. 155 units were injected using sterile technique across 31 sites as follows: corrugator 10, procerus 5 units, frontalis 20 units, temporalis 40 units, occipitalis 30 units, cervical paraspinal 20 units, trapezius 30 units. 45 units were wasted. Patient tolerated procedure without complication. ? ?Plan: ?-Continue Maxalt and Ubrelvy for rescue ?-Return for Botox in 3 months ? ?Sharon Osborn ?08/05/21 ?2:44 PM ? ? ?

## 2021-10-13 ENCOUNTER — Other Ambulatory Visit: Payer: Self-pay | Admitting: *Deleted

## 2021-10-13 MED ORDER — DICLOFENAC SODIUM 75 MG PO TBEC: 75.0000 mg | DELAYED_RELEASE_TABLET | Freq: Two times a day (BID) | ORAL | 3 refills | Status: AC | PRN

## 2021-10-19 ENCOUNTER — Other Ambulatory Visit: Payer: Self-pay | Admitting: Psychiatry

## 2021-10-19 ENCOUNTER — Telehealth: Payer: Self-pay | Admitting: Adult Health

## 2021-10-19 MED ORDER — METHYLPREDNISOLONE 4 MG PO TBPK: ORAL_TABLET | ORAL | 0 refills | Status: DC

## 2021-10-19 NOTE — Telephone Encounter (Signed)
Pt called needing to speak to the RN about some symptoms she is having. Pt would like to know if it has to do with her Migraines. Pt states that recently during a migraine she will also have numbness on her L pinky finger, L pinky toe and on the L side of her tongue  that will come and go with the migraine. Please advise.

## 2021-10-19 NOTE — Telephone Encounter (Signed)
I called pt and relayed Dr. Karn Cassis recommendation and she is agreeable to plan. She will let us know if anything changes between now and her scheduled botox appt.

## 2021-10-19 NOTE — Telephone Encounter (Signed)
I returned the pt's call. She repots on 10/15/2021 she had a migraine and new sx presented. She sts her migraine started early that morning and she noticed numbness/tingling localized to her L pinky finger, toe, and side of her tongue. She sts when her migraine resolved sx resolved.   She also reports her migarines have been worse since her last botox treatment. Report a migraine at least every other day.She confirmed she does have the ubrelvy and rizatriptan on hand and both are still helping. Frequency has increased. Her next botox injections are scheduled for 10/28/2021. Will send to MD for review, we maybe able to wait until 10/28/2021 to address.

## 2021-10-19 NOTE — Telephone Encounter (Signed)
Numbness can occur with migraine headaches as part of the migraine aura. As long as it resolved when her headache resolved we don't need to work it up further at this time. I sent in a steroid pack to Sharon Osborn to help with her migraines. We can discuss further next steps at our Botox appointment

## 2021-10-25 ENCOUNTER — Encounter (INDEPENDENT_AMBULATORY_CARE_PROVIDER_SITE_OTHER): Payer: 59 | Admitting: Psychiatry

## 2021-10-25 DIAGNOSIS — R200 Anesthesia of skin: Secondary | ICD-10-CM

## 2021-10-25 DIAGNOSIS — R202 Paresthesia of skin: Secondary | ICD-10-CM | POA: Diagnosis not present

## 2021-10-25 NOTE — Telephone Encounter (Signed)
Called patient and advised her per Dr Delena Bali if she's having persistent numbness she should go to the ED to get evaluated. This would be better than waiting for our appointment since we won't be able to get any urgent imaging in the office. She stated it has been going on for 10 days but her migraines are not worse. I advised her Dr Delena Bali has made her recommendation based on the my chart she sent today at 4 pm. I reviewed it with patient, and she stated "okay okay". The call was ended.

## 2021-10-25 NOTE — Telephone Encounter (Signed)
Pt LVM at 3:46 pm still having issues with tingling in hands even after the Prednisone. Do not feel normal, not anything I've had with migraines before. Would like a call back on what to do.

## 2021-10-25 NOTE — Telephone Encounter (Signed)
If she's having persistent numbness she should go to the ED to get evaluated. This would be better than waiting for our appointment since won't be able to get any urgent imaging in the office

## 2021-10-26 NOTE — Telephone Encounter (Signed)
Received (2) 100 unit vials of Botox from [facility]. 

## 2021-10-26 NOTE — Telephone Encounter (Signed)
Please see the MyChart message reply(ies) for my assessment and plan.    This patient gave consent for this Medical Advice Message and is aware that it may result in a bill to their insurance company, as well as the possibility of receiving a bill for a co-payment or deductible. They are an established patient, but are not seeking medical advice exclusively about a problem treated during an in person or video visit in the last seven days. I did not recommend an in person or video visit within seven days of my reply.    I spent a total of 10 minutes cumulative time within 7 days through MyChart messaging.  Tesean Stump, MD   

## 2021-10-28 ENCOUNTER — Ambulatory Visit: Payer: 59 | Admitting: Psychiatry

## 2021-11-01 ENCOUNTER — Encounter: Payer: Self-pay | Admitting: Psychiatry

## 2021-11-03 ENCOUNTER — Ambulatory Visit (INDEPENDENT_AMBULATORY_CARE_PROVIDER_SITE_OTHER): Payer: 59 | Admitting: Psychiatry

## 2021-11-03 VITALS — BP 156/98 | HR 95

## 2021-11-03 DIAGNOSIS — G43019 Migraine without aura, intractable, without status migrainosus: Secondary | ICD-10-CM | POA: Diagnosis not present

## 2021-11-03 DIAGNOSIS — G43109 Migraine with aura, not intractable, without status migrainosus: Secondary | ICD-10-CM

## 2021-11-03 DIAGNOSIS — R200 Anesthesia of skin: Secondary | ICD-10-CM

## 2021-11-03 MED ORDER — LORAZEPAM 0.5 MG PO TABS: ORAL_TABLET | ORAL | 0 refills | Status: DC

## 2021-11-03 NOTE — Progress Notes (Signed)
Botox- 100 units x 2 vial Lot: [id number] Expiration: 01/2024 NDC: 4765-4650-35   Bacteriostatic 0.9% Sodium Chloride- 74mL total [id number] Expiration: 12/22/22 NDC: 1700-1749-44   Dx: G43.019 BB

## 2021-11-03 NOTE — Progress Notes (Addendum)
[facility]  BOTULINUM TOXIN INJECTION PROCEDURE NOTE  Patient: Sharon Osborn MRN: [id number]  Indication: Chronic migraines   History: 46 year old female with a history of intractable chronic migraine. Since her last visit she has developed numbness on her left 4th/5th fingers, left side of her foot, and left tongue. Went to the ED were [facility] was unremarkable. She has never had an MRI. Initially were associated with a headache but have persisted for nearly one month after migraine ended. Exam with 3+ reflexes throughout and diminished sensation over left hand.  Last injection:   Technique: Informed consent was obtained and signed. 200 units onabotulinumtoxinA (Lot# [id number]; Expiration: 01/2024) were reconstituted using normal saline, to a concentration of 5 units per 0.80ml. 155 units were injected using sterile technique across 31 sites as follows: corrugator 10, procerus 5 units, frontalis 20 units, temporalis 40 units, occipitalis 30 units, cervical paraspinal 20 units, trapezius 30 units. 45 units were wasted. Patient tolerated procedure without complication.   Physical Exam:   Vital Signs: BP (!) 156/98   Pulse 95  GENERAL:  well appearing, in no acute distress, alert  SKIN:  Color, texture, turgor normal. No rashes or lesions HEAD:  Normocephalic/atraumatic. RESP: normal respiratory effort MSK:  No gross joint deformities.   NEUROLOGICAL: Mental Status: Alert, oriented to person, place and time, Follows commands, and Speech fluent and appropriate. Cranial Nerves: PERRL, face symmetric, no dysarthria, hearing grossly intact Motor: moves all extremities equally Sensory: decreased sensation over left hand Reflexes: 3+ throughout Gait: normal-based.    Plan: -MRI brain and C-spine with contrast -Continue Maxalt and Ubrelvy for rescue -Return for Botox in 3 months  Anderson Malta Stephens [place] 11/03/21 11:00 AM

## 2021-11-04 ENCOUNTER — Telehealth: Payer: Self-pay | Admitting: Adult Health

## 2021-11-04 NOTE — Telephone Encounter (Signed)
[employer] [place]) we do not have PA urgent MRI requested by Dr. Delena Bali. Would like nurse to call patient relay that PA has submitted.  PA Department Phone: [phone]

## 2021-11-08 NOTE — Telephone Encounter (Signed)
MRI cervical Auth: [id number] exp. 11/08/21-12/23/21  MRI brain is pending uploaded notes case #[id number]  I will fax the orders to [facility] per patient.

## 2021-11-08 NOTE — Telephone Encounter (Signed)
I had addressed her concerns in the history part of the note but maybe that was not enough. I added a formal exam to the note so we can see if that helps. Otherwise we can try to get her set up with a virtual follow up with Aundra Millet if they need a formal office visit

## 2021-11-08 NOTE — Telephone Encounter (Signed)
I sent [employer] the updated note from Dr. Delena Bali

## 2021-11-08 NOTE — Telephone Encounter (Signed)
Please see the messages below. What I had faxed was your notes from her last appointment, but since it was botox it was not enough. It looks like her last office visit was a virtual with Megan back in February, so I think we will need an new office visit so I can send notes with the info they need.

## 2021-11-08 NOTE — Telephone Encounter (Signed)
Sent patient message with update

## 2021-11-09 NOTE — Telephone Encounter (Signed)
MRI brain still pending

## 2021-11-10 NOTE — Telephone Encounter (Signed)
MRI brain w/wo contrast approved [id number] exp. 11/10/21-12/25/21

## 2021-11-24 ENCOUNTER — Telehealth: Payer: Self-pay | Admitting: *Deleted

## 2021-11-24 NOTE — Telephone Encounter (Signed)
Called patient and reviewed Dr Quentin Mulling results in detail. She had no questions. Since May she has numbness in last two fingers on left hand. She wants to find a PT facility in [place] and let us know before referral is placed. She will call for CD and mail to Dr Delena Bali. Patient verbalized understanding, appreciation.

## 2021-11-24 NOTE — Telephone Encounter (Signed)
Brain MRI shows an incidental 1.5 cm arachnoid cyst. These typically do not cause symptoms and it is likely unrelated to her symptoms. She also has a few nonspecific white matter hyperintensities (small white spots on the brain). These can be seen in patients with chronic migraines, but I would like to personally review the images if she could bring the CDs to our office.  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 This can cause left sided numbness. If she is still having symptoms I would recommend physical therapy for the neck as a first step. I will place this referral for her if she's agreeable to trying it

## 2021-11-24 NOTE — Telephone Encounter (Signed)
Received MRI brain, MRI cervical spine from [facility]. Reports placed on MD's desk for review.

## 2021-12-08 ENCOUNTER — Other Ambulatory Visit: Payer: Self-pay | Admitting: Psychiatry

## 2021-12-08 ENCOUNTER — Encounter: Payer: Self-pay | Admitting: Psychiatry

## 2021-12-08 ENCOUNTER — Encounter (INDEPENDENT_AMBULATORY_CARE_PROVIDER_SITE_OTHER): Payer: 59 | Admitting: Psychiatry

## 2021-12-08 DIAGNOSIS — R200 Anesthesia of skin: Secondary | ICD-10-CM

## 2021-12-08 DIAGNOSIS — M542 Cervicalgia: Secondary | ICD-10-CM

## 2021-12-08 DIAGNOSIS — M5412 Radiculopathy, cervical region: Secondary | ICD-10-CM | POA: Diagnosis not present

## 2021-12-08 NOTE — Telephone Encounter (Signed)
I sent the referral to [facility] for her. I don't have her MRI at my desk so I'm not sure where it is

## 2021-12-08 NOTE — Telephone Encounter (Signed)
Received CD of patient's scans, placed on MD desk for review.

## 2021-12-09 ENCOUNTER — Telehealth: Payer: Self-pay | Admitting: Psychiatry

## 2021-12-09 NOTE — Telephone Encounter (Signed)
I messaged Sharon Osborn with [facility] to get patient scheduled for DG FL GUIDED LUMBAR PUNCTURE.

## 2021-12-09 NOTE — Telephone Encounter (Signed)
Referral for Physical Therapy sent to [facility] [phone].

## 2021-12-13 ENCOUNTER — Other Ambulatory Visit: Payer: Self-pay | Admitting: Neurosurgery

## 2021-12-13 NOTE — Telephone Encounter (Signed)
Sent!

## 2021-12-13 NOTE — Telephone Encounter (Signed)
Patient left me a voice mail asking for her lumbar puncture to be sent to [facility] in [place] because it is closer to her. She gave me their fax number [phone]

## 2021-12-14 ENCOUNTER — Inpatient Hospital Stay: Admission: RE | Admit: 2021-12-14 | Payer: Self-pay | Source: Ambulatory Visit

## 2021-12-15 ENCOUNTER — Other Ambulatory Visit: Payer: Self-pay | Admitting: Psychiatry

## 2021-12-15 ENCOUNTER — Encounter: Payer: Self-pay | Admitting: Psychiatry

## 2021-12-15 MED ORDER — GABAPENTIN 300 MG PO CAPS: 300.0000 mg | ORAL_CAPSULE | Freq: Every day | ORAL | 6 refills | Status: DC | PRN

## 2021-12-15 NOTE — Telephone Encounter (Signed)

## 2021-12-17 ENCOUNTER — Other Ambulatory Visit: Payer: Self-pay

## 2021-12-20 ENCOUNTER — Telehealth: Payer: Self-pay | Admitting: *Deleted

## 2021-12-20 ENCOUNTER — Other Ambulatory Visit: Payer: 59

## 2021-12-20 DIAGNOSIS — Z0289 Encounter for other administrative examinations: Secondary | ICD-10-CM

## 2021-12-20 DIAGNOSIS — R200 Anesthesia of skin: Secondary | ICD-10-CM

## 2021-12-20 NOTE — Telephone Encounter (Signed)
Received message from phone staff: Sharon Osborn @ [name] calling re: LP scheduled for patient, she is asking for what labs need to be ran. Per Dr Delena Bali, need igg index, oligoclonal bands, protein, glucose, cell count. I called Melissa and advised her of labs needed. LP in two days. Melissa  verbalized understanding, appreciation.

## 2021-12-21 ENCOUNTER — Telehealth: Payer: Self-pay | Admitting: Psychiatry

## 2021-12-21 MED ORDER — LORAZEPAM 0.5 MG PO TABS: ORAL_TABLET | ORAL | 0 refills | Status: DC

## 2021-12-21 NOTE — Telephone Encounter (Signed)
Are you willing to proscribe pt something for LP ?

## 2021-12-21 NOTE — Telephone Encounter (Signed)
Pt is calling and requesting Dr. Delena Bali write her a prescription for something to calm her down while getting her test.

## 2021-12-21 NOTE — Telephone Encounter (Signed)
I sent ativan to her Public house manager. She'll just need a driver to the LP

## 2021-12-21 NOTE — Addendum Note (Signed)
Addended by: Ocie Doyne on: 12/21/2021 04:22 PM   Modules accepted: Orders

## 2021-12-31 ENCOUNTER — Encounter (INDEPENDENT_AMBULATORY_CARE_PROVIDER_SITE_OTHER): Payer: 59 | Admitting: Psychiatry

## 2021-12-31 DIAGNOSIS — R42 Dizziness and giddiness: Secondary | ICD-10-CM

## 2021-12-31 DIAGNOSIS — R200 Anesthesia of skin: Secondary | ICD-10-CM

## 2021-12-31 DIAGNOSIS — R253 Fasciculation: Secondary | ICD-10-CM

## 2022-01-04 NOTE — Telephone Encounter (Signed)
Please see the MyChart message reply(ies) for my assessment and plan.    This patient gave consent for this Medical Advice Message and is aware that it may result in a bill to [employer], as well as the possibility of receiving a bill for a co-payment or deductible. They are an established patient, but are not seeking medical advice exclusively about a problem treated during an in person or video visit in the last seven days. I did not recommend an in person or video visit within seven days of my reply.    I spent a total of 15 minutes cumulative time within 7 days through [employer].  Ocie Doyne, MD  01/06/22

## 2022-01-09 ENCOUNTER — Other Ambulatory Visit: Payer: Self-pay | Admitting: Psychiatry

## 2022-01-11 ENCOUNTER — Telehealth: Payer: Self-pay | Admitting: Adult Health

## 2022-01-11 NOTE — Telephone Encounter (Signed)
[facility] ([place]) request diagnostic ICD-10 Code for Botox. Would like a call back

## 2022-01-11 NOTE — Telephone Encounter (Signed)
I called [facility] and gave them ICD-10 code G43.709. Verified there was nothing else needed.

## 2022-01-11 NOTE — Addendum Note (Signed)
Addended by: Ocie Doyne on: 01/11/2022 04:18 PM   Modules accepted: Orders

## 2022-01-18 NOTE — Telephone Encounter (Signed)
We can discuss her symptoms at her upcoming appointment in September

## 2022-01-27 ENCOUNTER — Ambulatory Visit (INDEPENDENT_AMBULATORY_CARE_PROVIDER_SITE_OTHER): Payer: 59 | Admitting: Psychiatry

## 2022-01-27 VITALS — BP 140/96 | HR 89

## 2022-01-27 DIAGNOSIS — R29898 Other symptoms and signs involving the musculoskeletal system: Secondary | ICD-10-CM

## 2022-01-27 DIAGNOSIS — G43719 Chronic migraine without aura, intractable, without status migrainosus: Secondary | ICD-10-CM | POA: Diagnosis not present

## 2022-01-27 MED ORDER — ONABOTULINUMTOXINA 200 UNITS IJ SOLR
155.0000 [IU] | Freq: Once | INTRAMUSCULAR | Status: AC
  Administered 2022-01-27: 155 [IU] via INTRAMUSCULAR

## 2022-01-27 MED ORDER — PREGABALIN 50 MG PO CAPS: 50.0000 mg | ORAL_CAPSULE | Freq: Every day | ORAL | 6 refills | Status: AC | PRN

## 2022-01-27 MED ORDER — MECLIZINE HCL 12.5 MG PO TABS: 12.5000 mg | ORAL_TABLET | Freq: Three times a day (TID) | ORAL | 6 refills | Status: AC | PRN

## 2022-01-27 NOTE — Progress Notes (Signed)
Botox- 100 units x 2 vials Lot: [id number] Expiration: 10/25 NDC: 8185-6314-97  Bacteriostatic 0.9% Sodium Chloride- 58mL total Lot: [id number] Expiration: 22 Jan 2023 NDC: 5885-0277-41  Dx: G43.109 S/P

## 2022-01-27 NOTE — Progress Notes (Signed)
[facility]  BOTULINUM TOXIN INJECTION PROCEDURE NOTE  Patient: Sharon Osborn MRN: [id number]  Indication: Chronic migraines   History: 46 year old female who follows in clinic for intractable migraine. MRI brain 11/20/21 with nonspecific white matter changes. Given new onset left-sided numbness, LP was done. CSF was unremarkable (4 WBC, 6 RBC, 58 glucose, 46 protein, OCB negative). MRI C-spine showed severe left foraminal narrowing at C6-7 and moderate right foraminal narrowing at C5-6. She was referred to neck PT. Since her last visit she reports developing new symptoms including left leg weakness, stomach spasms, leg jerking/spasms, tongue feeling thick, vertigo, tics, altered pupil response, blurred vision, and lights in her vision. She continues to follow with PT for her symptoms with minimal improvement. Gabapentin helps with her neck pain but makes her drowsy.  Last injection: 11/03/21  Technique: Informed consent was obtained and signed. 200 units onabotulinumtoxinA (Lot# [id number]; Expiration: 10/25) were reconstituted using normal saline, to a concentration of 5 units per 0.45ml. 155 units were injected using sterile technique across 31 sites as follows: corrugator 10, procerus 5 units, frontalis 20 units, temporalis 40 units, occipitalis 30 units, cervical paraspinal 20 units, trapezius 30 units. 45 units were wasted. Patient tolerated procedure without complication.  Physical Exam: NEUROLOGICAL: Mental Status: Alert, oriented to person, place and time, Follows commands, and Speech fluent and appropriate. Cranial Nerves: PERRL, face symmetric, no dysarthria, hearing grossly intact Motor: left grip strength weakness, 4/5 left hip flexion and dorsiflexion Sensory: decreased sensation over left hand Reflexes: 3+ throughout   Plan: -MRI T-spine, L-spine for leg weakness and trunk spasms/paresthesias -Stop gabapentin. Start Lyrica 50 mg PRN -Start meclizine 12.5 PRN for  vertigo -Continue Maxalt and Ubrelvy for rescue -Return for Botox in 3 months  Ocie Doyne 01/27/22 10:58 AM

## 2022-02-09 ENCOUNTER — Telehealth: Payer: Self-pay | Admitting: Psychiatry

## 2022-02-09 NOTE — Telephone Encounter (Signed)
[facility] Josem Kaufmann: [id number] exp. 02/09/22-03/26/22 Lum[facility] Josem Kaufmann: [id number] exp. 02/09/22-03/26/22 sent to [facility]

## 2022-04-28 ENCOUNTER — Ambulatory Visit: Payer: 59 | Admitting: Psychiatry

## 2022-06-10 ENCOUNTER — Other Ambulatory Visit (HOSPITAL_COMMUNITY): Payer: Self-pay

## 2022-06-17 ENCOUNTER — Other Ambulatory Visit (HOSPITAL_COMMUNITY): Payer: Self-pay

## 2022-06-22 ENCOUNTER — Other Ambulatory Visit (HOSPITAL_COMMUNITY): Payer: Self-pay

## 2022-06-23 ENCOUNTER — Other Ambulatory Visit (HOSPITAL_COMMUNITY): Payer: Self-pay

## 2022-06-23 ENCOUNTER — Telehealth: Payer: Self-pay

## 2022-06-23 NOTE — Telephone Encounter (Signed)
PA request received via CMM for Ubrelvy 100MG  tablets  PA has been submitted to [facility] and is pending determination.   Key: [id number]

## 2022-06-24 NOTE — Telephone Encounter (Signed)
PA has been DENIED.   Denial form has been faxed to providers office.

## 2022-08-14 ENCOUNTER — Other Ambulatory Visit: Payer: Self-pay | Admitting: Adult Health
